# Patient Record
Sex: Female | Born: 1975 | Race: Black or African American | Hispanic: No | Marital: Single | Smoking: Former smoker
Health system: Southern US, Community
[De-identification: ages and names within clinical notes are randomized; demographics above are authoritative.]

## PROBLEM LIST (undated history)

## (undated) DIAGNOSIS — E785 Hyperlipidemia, unspecified: Secondary | ICD-10-CM

## (undated) DIAGNOSIS — K219 Gastro-esophageal reflux disease without esophagitis: Secondary | ICD-10-CM

## (undated) DIAGNOSIS — O24419 Gestational diabetes mellitus in pregnancy, unspecified control: Secondary | ICD-10-CM

## (undated) DIAGNOSIS — Z8719 Personal history of other diseases of the digestive system: Secondary | ICD-10-CM

## (undated) DIAGNOSIS — I10 Essential (primary) hypertension: Secondary | ICD-10-CM

## (undated) DIAGNOSIS — O139 Gestational [pregnancy-induced] hypertension without significant proteinuria, unspecified trimester: Secondary | ICD-10-CM

## (undated) HISTORY — PX: TUBAL LIGATION: SHX77

## (undated) HISTORY — PX: UPPER GI ENDOSCOPY: SHX6162

## (undated) HISTORY — DX: Gestational diabetes mellitus in pregnancy, unspecified control: O24.419

## (undated) HISTORY — PX: COLONOSCOPY: SHX174

## (undated) HISTORY — DX: Gestational (pregnancy-induced) hypertension without significant proteinuria, unspecified trimester: O13.9

---

## 1997-09-09 ENCOUNTER — Emergency Department (HOSPITAL_COMMUNITY): Admission: EM | Admit: 1997-09-09 | Discharge: 1997-09-09 | Payer: Self-pay | Admitting: Emergency Medicine

## 1999-02-26 ENCOUNTER — Emergency Department (HOSPITAL_COMMUNITY): Admission: EM | Admit: 1999-02-26 | Discharge: 1999-02-26 | Payer: Self-pay | Admitting: Emergency Medicine

## 1999-03-16 HISTORY — PX: BRAIN SURGERY: SHX531

## 1999-10-30 ENCOUNTER — Encounter: Payer: Self-pay | Admitting: Neurosurgery

## 1999-10-30 ENCOUNTER — Encounter: Payer: Self-pay | Admitting: Emergency Medicine

## 1999-10-30 ENCOUNTER — Ambulatory Visit (HOSPITAL_COMMUNITY): Admission: RE | Admit: 1999-10-30 | Discharge: 1999-10-30 | Payer: Self-pay | Admitting: *Deleted

## 1999-10-30 ENCOUNTER — Inpatient Hospital Stay (HOSPITAL_COMMUNITY): Admission: EM | Admit: 1999-10-30 | Discharge: 1999-11-04 | Payer: Self-pay | Admitting: Emergency Medicine

## 1999-11-01 ENCOUNTER — Encounter: Payer: Self-pay | Admitting: Neurosurgery

## 1999-11-02 ENCOUNTER — Encounter: Payer: Self-pay | Admitting: Neurosurgery

## 1999-11-04 ENCOUNTER — Encounter: Payer: Self-pay | Admitting: Neurosurgery

## 1999-12-17 ENCOUNTER — Inpatient Hospital Stay (HOSPITAL_COMMUNITY): Admission: AD | Admit: 1999-12-17 | Discharge: 1999-12-17 | Payer: Self-pay | Admitting: *Deleted

## 2000-01-21 ENCOUNTER — Ambulatory Visit (HOSPITAL_COMMUNITY): Admission: RE | Admit: 2000-01-21 | Discharge: 2000-01-21 | Payer: Self-pay | Admitting: *Deleted

## 2000-02-11 ENCOUNTER — Encounter: Admission: RE | Admit: 2000-02-11 | Discharge: 2000-02-11 | Payer: Self-pay | Admitting: Obstetrics

## 2000-03-02 ENCOUNTER — Encounter: Admission: RE | Admit: 2000-03-02 | Discharge: 2000-03-02 | Payer: Self-pay | Admitting: Obstetrics & Gynecology

## 2000-03-11 ENCOUNTER — Ambulatory Visit (HOSPITAL_COMMUNITY): Admission: RE | Admit: 2000-03-11 | Discharge: 2000-03-11 | Payer: Self-pay | Admitting: Obstetrics

## 2000-03-30 ENCOUNTER — Encounter: Admission: RE | Admit: 2000-03-30 | Discharge: 2000-03-30 | Payer: Self-pay | Admitting: Obstetrics & Gynecology

## 2000-04-27 ENCOUNTER — Encounter: Admission: RE | Admit: 2000-04-27 | Discharge: 2000-04-27 | Payer: Self-pay | Admitting: Obstetrics & Gynecology

## 2000-05-11 ENCOUNTER — Encounter: Admission: RE | Admit: 2000-05-11 | Discharge: 2000-05-11 | Payer: Self-pay | Admitting: Obstetrics & Gynecology

## 2000-05-18 ENCOUNTER — Encounter: Admission: RE | Admit: 2000-05-18 | Discharge: 2000-05-18 | Payer: Self-pay | Admitting: Obstetrics & Gynecology

## 2000-06-01 ENCOUNTER — Encounter: Admission: RE | Admit: 2000-06-01 | Discharge: 2000-06-01 | Payer: Self-pay | Admitting: Obstetrics & Gynecology

## 2000-06-08 ENCOUNTER — Encounter: Admission: RE | Admit: 2000-06-08 | Discharge: 2000-06-08 | Payer: Self-pay | Admitting: Obstetrics & Gynecology

## 2000-06-09 ENCOUNTER — Ambulatory Visit (HOSPITAL_COMMUNITY): Admission: RE | Admit: 2000-06-09 | Discharge: 2000-06-09 | Payer: Self-pay | Admitting: *Deleted

## 2000-06-16 ENCOUNTER — Encounter: Admission: RE | Admit: 2000-06-16 | Discharge: 2000-06-16 | Payer: Self-pay | Admitting: Obstetrics

## 2000-06-20 ENCOUNTER — Encounter: Admission: RE | Admit: 2000-06-20 | Discharge: 2000-06-20 | Payer: Self-pay | Admitting: Internal Medicine

## 2000-06-23 ENCOUNTER — Encounter: Admission: RE | Admit: 2000-06-23 | Discharge: 2000-06-23 | Payer: Self-pay | Admitting: Obstetrics

## 2000-06-30 ENCOUNTER — Encounter: Admission: RE | Admit: 2000-06-30 | Discharge: 2000-06-30 | Payer: Self-pay | Admitting: Obstetrics

## 2000-07-07 ENCOUNTER — Encounter: Admission: RE | Admit: 2000-07-07 | Discharge: 2000-07-07 | Payer: Self-pay | Admitting: Obstetrics

## 2000-07-21 ENCOUNTER — Encounter: Admission: RE | Admit: 2000-07-21 | Discharge: 2000-07-21 | Payer: Self-pay | Admitting: Obstetrics

## 2000-07-28 ENCOUNTER — Inpatient Hospital Stay (HOSPITAL_COMMUNITY): Admission: AD | Admit: 2000-07-28 | Discharge: 2000-07-31 | Payer: Self-pay | Admitting: Obstetrics

## 2000-07-28 ENCOUNTER — Encounter: Admission: RE | Admit: 2000-07-28 | Discharge: 2000-07-28 | Payer: Self-pay | Admitting: Obstetrics

## 2000-08-04 ENCOUNTER — Inpatient Hospital Stay (HOSPITAL_COMMUNITY): Admission: AD | Admit: 2000-08-04 | Discharge: 2000-08-04 | Payer: Self-pay | Admitting: Obstetrics & Gynecology

## 2001-10-05 ENCOUNTER — Inpatient Hospital Stay (HOSPITAL_COMMUNITY): Admission: AD | Admit: 2001-10-05 | Discharge: 2001-10-06 | Payer: Self-pay | Admitting: General Surgery

## 2001-10-05 ENCOUNTER — Encounter (INDEPENDENT_AMBULATORY_CARE_PROVIDER_SITE_OTHER): Payer: Self-pay | Admitting: *Deleted

## 2001-10-05 ENCOUNTER — Encounter: Payer: Self-pay | Admitting: General Surgery

## 2001-10-05 ENCOUNTER — Encounter: Payer: Self-pay | Admitting: *Deleted

## 2002-03-15 HISTORY — PX: CHOLECYSTECTOMY: SHX55

## 2002-11-28 ENCOUNTER — Encounter: Payer: Self-pay | Admitting: Emergency Medicine

## 2002-11-28 ENCOUNTER — Emergency Department (HOSPITAL_COMMUNITY): Admission: EM | Admit: 2002-11-28 | Discharge: 2002-11-28 | Payer: Self-pay | Admitting: Emergency Medicine

## 2002-12-03 ENCOUNTER — Emergency Department (HOSPITAL_COMMUNITY): Admission: EM | Admit: 2002-12-03 | Discharge: 2002-12-03 | Payer: Self-pay | Admitting: Emergency Medicine

## 2004-06-01 ENCOUNTER — Emergency Department (HOSPITAL_COMMUNITY): Admission: EM | Admit: 2004-06-01 | Discharge: 2004-06-01 | Payer: Self-pay | Admitting: Family Medicine

## 2004-10-16 ENCOUNTER — Emergency Department (HOSPITAL_COMMUNITY): Admission: EM | Admit: 2004-10-16 | Discharge: 2004-10-16 | Payer: Self-pay | Admitting: Family Medicine

## 2004-10-29 ENCOUNTER — Emergency Department (HOSPITAL_COMMUNITY): Admission: EM | Admit: 2004-10-29 | Discharge: 2004-10-29 | Payer: Self-pay | Admitting: Family Medicine

## 2004-11-23 ENCOUNTER — Emergency Department (HOSPITAL_COMMUNITY): Admission: EM | Admit: 2004-11-23 | Discharge: 2004-11-23 | Payer: Self-pay | Admitting: Family Medicine

## 2004-12-07 ENCOUNTER — Ambulatory Visit (HOSPITAL_BASED_OUTPATIENT_CLINIC_OR_DEPARTMENT_OTHER): Admission: RE | Admit: 2004-12-07 | Discharge: 2004-12-08 | Payer: Self-pay | Admitting: Specialist

## 2004-12-07 ENCOUNTER — Encounter (INDEPENDENT_AMBULATORY_CARE_PROVIDER_SITE_OTHER): Payer: Self-pay | Admitting: Specialist

## 2004-12-07 ENCOUNTER — Ambulatory Visit (HOSPITAL_COMMUNITY): Admission: RE | Admit: 2004-12-07 | Discharge: 2004-12-07 | Payer: Self-pay | Admitting: Specialist

## 2005-02-23 ENCOUNTER — Emergency Department (HOSPITAL_COMMUNITY): Admission: AD | Admit: 2005-02-23 | Discharge: 2005-02-23 | Payer: Self-pay | Admitting: Emergency Medicine

## 2005-07-19 ENCOUNTER — Emergency Department (HOSPITAL_COMMUNITY): Admission: EM | Admit: 2005-07-19 | Discharge: 2005-07-19 | Payer: Self-pay | Admitting: Family Medicine

## 2006-03-15 HISTORY — PX: BREAST REDUCTION SURGERY: SHX8

## 2006-03-15 HISTORY — PX: BREAST SURGERY: SHX581

## 2006-06-13 ENCOUNTER — Emergency Department (HOSPITAL_COMMUNITY): Admission: EM | Admit: 2006-06-13 | Discharge: 2006-06-14 | Payer: Self-pay | Admitting: Emergency Medicine

## 2007-10-14 ENCOUNTER — Emergency Department (HOSPITAL_COMMUNITY): Admission: EM | Admit: 2007-10-14 | Discharge: 2007-10-14 | Payer: Self-pay | Admitting: Emergency Medicine

## 2009-03-15 ENCOUNTER — Inpatient Hospital Stay (HOSPITAL_COMMUNITY): Admission: AD | Admit: 2009-03-15 | Discharge: 2009-03-15 | Payer: Self-pay | Admitting: Obstetrics & Gynecology

## 2009-04-10 ENCOUNTER — Ambulatory Visit: Payer: Self-pay | Admitting: Family Medicine

## 2009-04-28 ENCOUNTER — Ambulatory Visit (HOSPITAL_COMMUNITY): Admission: RE | Admit: 2009-04-28 | Discharge: 2009-04-28 | Payer: Self-pay | Admitting: Family Medicine

## 2009-05-08 ENCOUNTER — Ambulatory Visit: Payer: Self-pay | Admitting: Obstetrics and Gynecology

## 2009-05-22 ENCOUNTER — Ambulatory Visit (HOSPITAL_COMMUNITY): Admission: RE | Admit: 2009-05-22 | Discharge: 2009-05-22 | Payer: Self-pay | Admitting: Family Medicine

## 2009-06-05 ENCOUNTER — Ambulatory Visit (HOSPITAL_COMMUNITY): Admission: RE | Admit: 2009-06-05 | Discharge: 2009-06-05 | Payer: Self-pay | Admitting: Obstetrics & Gynecology

## 2009-06-12 ENCOUNTER — Ambulatory Visit: Payer: Self-pay | Admitting: Obstetrics & Gynecology

## 2009-07-10 ENCOUNTER — Ambulatory Visit: Payer: Self-pay | Admitting: Obstetrics & Gynecology

## 2009-07-10 ENCOUNTER — Encounter: Payer: Self-pay | Admitting: Obstetrics & Gynecology

## 2009-07-10 LAB — CONVERTED CEMR LAB
HCT: 31.2 % — ABNORMAL LOW (ref 36.0–46.0)
Hemoglobin: 9.8 g/dL — ABNORMAL LOW (ref 12.0–15.0)
MCHC: 31.4 g/dL (ref 30.0–36.0)
MCV: 90.2 fL (ref 78.0–100.0)
Platelets: 320 10*3/uL (ref 150–400)
RBC: 3.46 M/uL — ABNORMAL LOW (ref 3.87–5.11)
RDW: 12.8 % (ref 11.5–15.5)
WBC: 16.9 10*3/uL — ABNORMAL HIGH (ref 4.0–10.5)

## 2009-08-07 ENCOUNTER — Encounter (INDEPENDENT_AMBULATORY_CARE_PROVIDER_SITE_OTHER): Payer: Self-pay | Admitting: *Deleted

## 2009-08-07 ENCOUNTER — Ambulatory Visit: Payer: Self-pay | Admitting: Obstetrics & Gynecology

## 2009-08-08 LAB — CONVERTED CEMR LAB
HCT: 31.5 % — ABNORMAL LOW (ref 36.0–46.0)
Hemoglobin: 10 g/dL — ABNORMAL LOW (ref 12.0–15.0)
MCHC: 31.7 g/dL (ref 30.0–36.0)
MCV: 90.3 fL (ref 78.0–100.0)
Platelets: 342 10*3/uL (ref 150–400)
RBC: 3.49 M/uL — ABNORMAL LOW (ref 3.87–5.11)
RDW: 13.4 % (ref 11.5–15.5)
WBC: 16.4 10*3/uL — ABNORMAL HIGH (ref 4.0–10.5)

## 2009-08-13 ENCOUNTER — Ambulatory Visit: Payer: Self-pay | Admitting: Obstetrics and Gynecology

## 2009-08-13 ENCOUNTER — Encounter (INDEPENDENT_AMBULATORY_CARE_PROVIDER_SITE_OTHER): Payer: Self-pay | Admitting: *Deleted

## 2009-08-25 ENCOUNTER — Encounter: Admission: RE | Admit: 2009-08-25 | Discharge: 2009-09-01 | Payer: Self-pay | Admitting: Obstetrics and Gynecology

## 2009-08-25 ENCOUNTER — Ambulatory Visit: Payer: Self-pay | Admitting: Obstetrics & Gynecology

## 2009-09-01 ENCOUNTER — Encounter (INDEPENDENT_AMBULATORY_CARE_PROVIDER_SITE_OTHER): Payer: Self-pay | Admitting: *Deleted

## 2009-09-01 ENCOUNTER — Ambulatory Visit: Payer: Self-pay | Admitting: Obstetrics & Gynecology

## 2009-09-01 LAB — CONVERTED CEMR LAB
HCT: 31.8 % — ABNORMAL LOW (ref 36.0–46.0)
Hemoglobin: 10.1 g/dL — ABNORMAL LOW (ref 12.0–15.0)
MCHC: 31.8 g/dL (ref 30.0–36.0)
MCV: 89.6 fL (ref 78.0–100.0)
Platelets: 340 10*3/uL (ref 150–400)
RBC: 3.55 M/uL — ABNORMAL LOW (ref 3.87–5.11)
RDW: 13.3 % (ref 11.5–15.5)
WBC: 14.8 10*3/uL — ABNORMAL HIGH (ref 4.0–10.5)

## 2009-09-04 ENCOUNTER — Ambulatory Visit (HOSPITAL_COMMUNITY): Admission: RE | Admit: 2009-09-04 | Discharge: 2009-09-04 | Payer: Self-pay | Admitting: Obstetrics & Gynecology

## 2009-09-08 ENCOUNTER — Ambulatory Visit: Payer: Self-pay | Admitting: Obstetrics & Gynecology

## 2009-09-22 ENCOUNTER — Inpatient Hospital Stay (HOSPITAL_COMMUNITY): Admission: AD | Admit: 2009-09-22 | Discharge: 2009-09-22 | Payer: Self-pay | Admitting: Obstetrics & Gynecology

## 2009-09-22 ENCOUNTER — Ambulatory Visit: Payer: Self-pay | Admitting: Obstetrics & Gynecology

## 2009-09-22 ENCOUNTER — Ambulatory Visit: Payer: Self-pay | Admitting: Family Medicine

## 2009-09-29 ENCOUNTER — Ambulatory Visit: Payer: Self-pay | Admitting: Obstetrics and Gynecology

## 2009-10-06 ENCOUNTER — Ambulatory Visit: Payer: Self-pay | Admitting: Obstetrics & Gynecology

## 2009-10-06 LAB — CONVERTED CEMR LAB
Chlamydia, DNA Probe: NEGATIVE
GC Probe Amp, Genital: NEGATIVE

## 2009-10-07 ENCOUNTER — Encounter: Payer: Self-pay | Admitting: Obstetrics & Gynecology

## 2009-10-07 LAB — CONVERTED CEMR LAB
Trich, Wet Prep: NONE SEEN
WBC, Wet Prep HPF POC: NONE SEEN
Yeast Wet Prep HPF POC: NONE SEEN

## 2009-10-13 ENCOUNTER — Ambulatory Visit: Payer: Self-pay | Admitting: Family Medicine

## 2009-10-13 ENCOUNTER — Encounter: Payer: Self-pay | Admitting: Obstetrics & Gynecology

## 2009-10-13 LAB — CONVERTED CEMR LAB
ALT: 21 units/L (ref 0–35)
AST: 15 units/L (ref 0–37)
Albumin: 3.3 g/dL — ABNORMAL LOW (ref 3.5–5.2)
Alkaline Phosphatase: 114 units/L (ref 39–117)
BUN: 4 mg/dL — ABNORMAL LOW (ref 6–23)
CO2: 20 meq/L (ref 19–32)
Calcium: 9 mg/dL (ref 8.4–10.5)
Chloride: 106 meq/L (ref 96–112)
Creatinine, Ser: 0.43 mg/dL (ref 0.40–1.20)
Glucose, Bld: 73 mg/dL (ref 70–99)
HCT: 33 % — ABNORMAL LOW (ref 36.0–46.0)
Hemoglobin: 10.6 g/dL — ABNORMAL LOW (ref 12.0–15.0)
MCHC: 32.1 g/dL (ref 30.0–36.0)
MCV: 87.8 fL (ref 78.0–100.0)
Platelets: 353 10*3/uL (ref 150–400)
Potassium: 3.8 meq/L (ref 3.5–5.3)
RBC: 3.76 M/uL — ABNORMAL LOW (ref 3.87–5.11)
RDW: 13.7 % (ref 11.5–15.5)
Sodium: 138 meq/L (ref 135–145)
Total Bilirubin: 0.6 mg/dL (ref 0.3–1.2)
Total Protein: 5.9 g/dL — ABNORMAL LOW (ref 6.0–8.3)
WBC: 13.4 10*3/uL — ABNORMAL HIGH (ref 4.0–10.5)

## 2009-10-15 ENCOUNTER — Encounter: Payer: Self-pay | Admitting: Obstetrics & Gynecology

## 2009-10-15 LAB — CONVERTED CEMR LAB
Collection Interval-CRCL: 24 hr
Creatinine 24 HR UR: 1362 mg/24hr (ref 700–1800)
Creatinine Clearance: 220 mL/min — ABNORMAL HIGH (ref 75–115)
Creatinine, Urine: 43.9 mg/dL
Protein, Ur: 248 mg/24hr — ABNORMAL HIGH (ref 50–100)

## 2009-10-20 ENCOUNTER — Ambulatory Visit: Payer: Self-pay | Admitting: Obstetrics & Gynecology

## 2009-10-20 ENCOUNTER — Inpatient Hospital Stay (HOSPITAL_COMMUNITY): Admission: RE | Admit: 2009-10-20 | Discharge: 2009-10-24 | Payer: Self-pay | Admitting: Family Medicine

## 2009-10-20 ENCOUNTER — Ambulatory Visit: Payer: Self-pay | Admitting: Obstetrics and Gynecology

## 2009-10-20 ENCOUNTER — Other Ambulatory Visit: Payer: Self-pay | Admitting: Family Medicine

## 2009-12-17 ENCOUNTER — Ambulatory Visit: Payer: Self-pay | Admitting: Obstetrics & Gynecology

## 2010-01-28 ENCOUNTER — Ambulatory Visit: Payer: Self-pay | Admitting: Obstetrics and Gynecology

## 2010-04-05 ENCOUNTER — Encounter: Payer: Self-pay | Admitting: Family Medicine

## 2010-04-21 ENCOUNTER — Ambulatory Visit: Payer: Medicaid Other

## 2010-04-21 DIAGNOSIS — Z3049 Encounter for surveillance of other contraceptives: Secondary | ICD-10-CM

## 2010-05-29 LAB — POCT URINALYSIS DIPSTICK
Bilirubin Urine: NEGATIVE
Glucose, UA: 100 mg/dL — AB
Glucose, UA: NEGATIVE mg/dL
Ketones, ur: NEGATIVE mg/dL
Ketones, ur: NEGATIVE mg/dL
Nitrite: NEGATIVE
Nitrite: NEGATIVE
Protein, ur: 100 mg/dL — AB
Protein, ur: 100 mg/dL — AB
Specific Gravity, Urine: 1.02 (ref 1.005–1.030)
Specific Gravity, Urine: 1.02 (ref 1.005–1.030)
Urobilinogen, UA: 1 mg/dL (ref 0.0–1.0)
Urobilinogen, UA: 2 mg/dL — ABNORMAL HIGH (ref 0.0–1.0)
pH: 6.5 (ref 5.0–8.0)
pH: 7 (ref 5.0–8.0)

## 2010-05-29 LAB — CREATININE CLEARANCE, URINE, 24 HOUR
Collection Interval-CRCL: 24 hours
Creatinine Clearance: 237 mL/min — ABNORMAL HIGH (ref 75–115)
Creatinine, 24H Ur: 1469 mg/d (ref 700–1800)
Creatinine, Urine: 47 mg/dL
Creatinine: 0.43 mg/dL (ref 0.4–1.2)
Urine Total Volume-CRCL: 3125 mL

## 2010-05-29 LAB — CBC
HCT: 31.8 % — ABNORMAL LOW (ref 36.0–46.0)
HCT: 31.9 % — ABNORMAL LOW (ref 36.0–46.0)
HCT: 32.6 % — ABNORMAL LOW (ref 36.0–46.0)
Hemoglobin: 10.6 g/dL — ABNORMAL LOW (ref 12.0–15.0)
Hemoglobin: 10.6 g/dL — ABNORMAL LOW (ref 12.0–15.0)
Hemoglobin: 11 g/dL — ABNORMAL LOW (ref 12.0–15.0)
MCH: 29.7 pg (ref 26.0–34.0)
MCH: 29.8 pg (ref 26.0–34.0)
MCH: 30 pg (ref 26.0–34.0)
MCHC: 33.4 g/dL (ref 30.0–36.0)
MCHC: 33.5 g/dL (ref 30.0–36.0)
MCHC: 33.9 g/dL (ref 30.0–36.0)
MCV: 88.5 fL (ref 78.0–100.0)
MCV: 89 fL (ref 78.0–100.0)
MCV: 89.2 fL (ref 78.0–100.0)
Platelets: 307 10*3/uL (ref 150–400)
Platelets: 312 10*3/uL (ref 150–400)
Platelets: 316 10*3/uL (ref 150–400)
RBC: 3.56 MIL/uL — ABNORMAL LOW (ref 3.87–5.11)
RBC: 3.58 MIL/uL — ABNORMAL LOW (ref 3.87–5.11)
RBC: 3.68 MIL/uL — ABNORMAL LOW (ref 3.87–5.11)
RDW: 13.2 % (ref 11.5–15.5)
RDW: 13.3 % (ref 11.5–15.5)
RDW: 13.7 % (ref 11.5–15.5)
WBC: 11.8 10*3/uL — ABNORMAL HIGH (ref 4.0–10.5)
WBC: 13.7 10*3/uL — ABNORMAL HIGH (ref 4.0–10.5)
WBC: 16 10*3/uL — ABNORMAL HIGH (ref 4.0–10.5)

## 2010-05-29 LAB — GLUCOSE, CAPILLARY
Glucose-Capillary: 101 mg/dL — ABNORMAL HIGH (ref 70–99)
Glucose-Capillary: 107 mg/dL — ABNORMAL HIGH (ref 70–99)
Glucose-Capillary: 108 mg/dL — ABNORMAL HIGH (ref 70–99)
Glucose-Capillary: 136 mg/dL — ABNORMAL HIGH (ref 70–99)
Glucose-Capillary: 142 mg/dL — ABNORMAL HIGH (ref 70–99)
Glucose-Capillary: 144 mg/dL — ABNORMAL HIGH (ref 70–99)
Glucose-Capillary: 81 mg/dL (ref 70–99)
Glucose-Capillary: 81 mg/dL (ref 70–99)
Glucose-Capillary: 92 mg/dL (ref 70–99)

## 2010-05-29 LAB — COMPREHENSIVE METABOLIC PANEL
ALT: 28 U/L (ref 0–35)
AST: 18 U/L (ref 0–37)
Albumin: 2.7 g/dL — ABNORMAL LOW (ref 3.5–5.2)
Alkaline Phosphatase: 114 U/L (ref 39–117)
BUN: 3 mg/dL — ABNORMAL LOW (ref 6–23)
CO2: 23 mEq/L (ref 19–32)
Calcium: 8.8 mg/dL (ref 8.4–10.5)
Chloride: 107 mEq/L (ref 96–112)
Creatinine, Ser: 0.43 mg/dL (ref 0.4–1.2)
GFR calc Af Amer: >60 mL/min (ref >60)
GFR calc non Af Amer: >60 mL/min (ref >60)
Glucose, Bld: 91 mg/dL (ref 70–99)
Potassium: 3 mEq/L — ABNORMAL LOW (ref 3.5–5.1)
Sodium: 135 mEq/L (ref 135–145)
Total Bilirubin: 0.5 mg/dL (ref 0.3–1.2)
Total Protein: 6 g/dL (ref 6.0–8.3)

## 2010-05-29 LAB — PROTEIN, URINE, 24 HOUR
Collection Interval-UPROT: 24 hours
Protein, 24H Urine: 406 mg/d — ABNORMAL HIGH (ref 50–100)
Protein, Urine: 13 mg/dL
Urine Total Volume-UPROT: 3125 mL

## 2010-05-29 LAB — RPR: RPR Ser Ql: NONREACTIVE

## 2010-05-29 LAB — MRSA PCR SCREENING: MRSA by PCR: NEGATIVE

## 2010-05-30 LAB — POCT URINALYSIS DIP (DEVICE)
Bilirubin Urine: NEGATIVE
Bilirubin Urine: NEGATIVE
Glucose, UA: NEGATIVE mg/dL
Glucose, UA: NEGATIVE mg/dL
Ketones, ur: NEGATIVE mg/dL
Ketones, ur: NEGATIVE mg/dL
Nitrite: NEGATIVE
Nitrite: NEGATIVE
Protein, ur: 30 mg/dL — AB
Protein, ur: NEGATIVE mg/dL
Specific Gravity, Urine: 1.015 (ref 1.005–1.030)
Specific Gravity, Urine: 1.015 (ref 1.005–1.030)
Urobilinogen, UA: 0.2 mg/dL (ref 0.0–1.0)
Urobilinogen, UA: 1 mg/dL (ref 0.0–1.0)
pH: 7 (ref 5.0–8.0)
pH: 7 (ref 5.0–8.0)

## 2010-05-30 LAB — HCG, QUANTITATIVE, PREGNANCY: hCG, Beta Chain, Quant, S: 57264 m[IU]/mL — ABNORMAL HIGH (ref <5)

## 2010-05-30 LAB — URINALYSIS, ROUTINE W REFLEX MICROSCOPIC
Bilirubin Urine: NEGATIVE
Glucose, UA: NEGATIVE mg/dL
Hgb urine dipstick: NEGATIVE
Ketones, ur: NEGATIVE mg/dL
Nitrite: NEGATIVE
Protein, ur: NEGATIVE mg/dL
Specific Gravity, Urine: 1.025 (ref 1.005–1.030)
Urobilinogen, UA: 0.2 mg/dL (ref 0.0–1.0)
pH: 6.5 (ref 5.0–8.0)

## 2010-05-30 LAB — WET PREP, GENITAL
Trich, Wet Prep: NONE SEEN
Yeast Wet Prep HPF POC: NONE SEEN

## 2010-05-30 LAB — POCT PREGNANCY, URINE: Preg Test, Ur: POSITIVE

## 2010-05-30 LAB — GC/CHLAMYDIA PROBE AMP, GENITAL
Chlamydia, DNA Probe: NEGATIVE
GC Probe Amp, Genital: NEGATIVE

## 2010-05-31 LAB — CBC
HCT: 31.4 % — ABNORMAL LOW (ref 36.0–46.0)
Hemoglobin: 10.5 g/dL — ABNORMAL LOW (ref 12.0–15.0)
MCH: 29.8 pg (ref 26.0–34.0)
MCHC: 33.5 g/dL (ref 30.0–36.0)
MCV: 89.1 fL (ref 78.0–100.0)
Platelets: 333 10*3/uL (ref 150–400)
RBC: 3.53 MIL/uL — ABNORMAL LOW (ref 3.87–5.11)
RDW: 13.1 % (ref 11.5–15.5)
WBC: 16.1 10*3/uL — ABNORMAL HIGH (ref 4.0–10.5)

## 2010-05-31 LAB — COMPREHENSIVE METABOLIC PANEL
ALT: 24 U/L (ref 0–35)
AST: 17 U/L (ref 0–37)
Albumin: 2.9 g/dL — ABNORMAL LOW (ref 3.5–5.2)
Alkaline Phosphatase: 102 U/L (ref 39–117)
BUN: 2 mg/dL — ABNORMAL LOW (ref 6–23)
CO2: 22 mEq/L (ref 19–32)
Calcium: 8.8 mg/dL (ref 8.4–10.5)
Chloride: 107 mEq/L (ref 96–112)
Creatinine, Ser: 0.44 mg/dL (ref 0.4–1.2)
GFR calc Af Amer: >60 mL/min (ref >60)
GFR calc non Af Amer: >60 mL/min (ref >60)
Glucose, Bld: 82 mg/dL (ref 70–99)
Potassium: 3.6 mEq/L (ref 3.5–5.1)
Sodium: 139 mEq/L (ref 135–145)
Total Bilirubin: 0.7 mg/dL (ref 0.3–1.2)
Total Protein: 6 g/dL (ref 6.0–8.3)

## 2010-05-31 LAB — POCT URINALYSIS DIP (DEVICE)
Bilirubin Urine: NEGATIVE
Bilirubin Urine: NEGATIVE
Bilirubin Urine: NEGATIVE
Bilirubin Urine: NEGATIVE
Glucose, UA: NEGATIVE mg/dL
Glucose, UA: NEGATIVE mg/dL
Glucose, UA: >=1000 mg/dL — AB
Glucose, UA: NEGATIVE mg/dL
Hgb urine dipstick: NEGATIVE
Ketones, ur: NEGATIVE mg/dL
Ketones, ur: NEGATIVE mg/dL
Ketones, ur: NEGATIVE mg/dL
Nitrite: NEGATIVE
Nitrite: NEGATIVE
Nitrite: NEGATIVE
Nitrite: NEGATIVE
Protein, ur: 30 mg/dL — AB
Protein, ur: NEGATIVE mg/dL
Protein, ur: 30 mg/dL — AB
Protein, ur: 30 mg/dL — AB
Specific Gravity, Urine: 1.02 (ref 1.005–1.030)
Specific Gravity, Urine: 1.015 (ref 1.005–1.030)
Specific Gravity, Urine: 1.015 (ref 1.005–1.030)
Specific Gravity, Urine: 1.02 (ref 1.005–1.030)
Urobilinogen, UA: 1 mg/dL (ref 0.0–1.0)
Urobilinogen, UA: 0.2 mg/dL (ref 0.0–1.0)
Urobilinogen, UA: 1 mg/dL (ref 0.0–1.0)
Urobilinogen, UA: 1 mg/dL (ref 0.0–1.0)
pH: 7 (ref 5.0–8.0)
pH: 7.5 (ref 5.0–8.0)
pH: 5.5 (ref 5.0–8.0)
pH: 6.5 (ref 5.0–8.0)

## 2010-05-31 LAB — CREATININE CLEARANCE, URINE, 24 HOUR
Collection Interval-CRCL: 24 hours
Creatinine Clearance: 237 mL/min — ABNORMAL HIGH (ref 75–115)
Creatinine, 24H Ur: 1502 mg/d (ref 700–1800)
Creatinine, Urine: 65.3 mg/dL
Creatinine: 0.44 mg/dL (ref 0.4–1.2)
Urine Total Volume-CRCL: 2300 mL

## 2010-05-31 LAB — URINALYSIS, ROUTINE W REFLEX MICROSCOPIC
Bilirubin Urine: NEGATIVE
Glucose, UA: NEGATIVE mg/dL
Ketones, ur: NEGATIVE mg/dL
Leukocytes, UA: NEGATIVE
Nitrite: NEGATIVE
Protein, ur: NEGATIVE mg/dL
Specific Gravity, Urine: 1.02 (ref 1.005–1.030)
Urobilinogen, UA: 0.2 mg/dL (ref 0.0–1.0)
pH: 7 (ref 5.0–8.0)

## 2010-05-31 LAB — GLUCOSE, CAPILLARY
Glucose-Capillary: 88 mg/dL (ref 70–99)
Glucose-Capillary: 131 mg/dL — ABNORMAL HIGH (ref 70–99)
Glucose-Capillary: 89 mg/dL (ref 70–99)

## 2010-05-31 LAB — PROTEIN, URINE, 24 HOUR
Collection Interval-UPROT: 24 hours
Protein, 24H Urine: 276 mg/d — ABNORMAL HIGH (ref 50–100)
Protein, Urine: 12 mg/dL
Urine Total Volume-UPROT: 2300 mL

## 2010-05-31 LAB — PROTEIN / CREATININE RATIO, URINE
Creatinine, Urine: 94 mg/dL
Protein Creatinine Ratio: 0.37 — ABNORMAL HIGH (ref 0.00–0.15)
Total Protein, Urine: 35 mg/dL

## 2010-05-31 LAB — URINE MICROSCOPIC-ADD ON

## 2010-06-01 LAB — POCT URINALYSIS DIP (DEVICE)
Bilirubin Urine: NEGATIVE
Bilirubin Urine: NEGATIVE
Glucose, UA: NEGATIVE mg/dL
Glucose, UA: NEGATIVE mg/dL
Ketones, ur: NEGATIVE mg/dL
Ketones, ur: NEGATIVE mg/dL
Nitrite: NEGATIVE
Nitrite: NEGATIVE
Protein, ur: NEGATIVE mg/dL
Protein, ur: 30 mg/dL — AB
Specific Gravity, Urine: 1.02 (ref 1.005–1.030)
Specific Gravity, Urine: 1.02 (ref 1.005–1.030)
Urobilinogen, UA: 1 mg/dL (ref 0.0–1.0)
Urobilinogen, UA: 2 mg/dL — ABNORMAL HIGH (ref 0.0–1.0)
pH: 6.5 (ref 5.0–8.0)
pH: 7 (ref 5.0–8.0)

## 2010-06-02 LAB — POCT URINALYSIS DIP (DEVICE)
Bilirubin Urine: NEGATIVE
Glucose, UA: NEGATIVE mg/dL
Ketones, ur: NEGATIVE mg/dL
Nitrite: NEGATIVE
Protein, ur: NEGATIVE mg/dL
Specific Gravity, Urine: 1.025 (ref 1.005–1.030)
Urobilinogen, UA: 0.2 mg/dL (ref 0.0–1.0)
pH: 6 (ref 5.0–8.0)

## 2010-06-03 LAB — POCT URINALYSIS DIP (DEVICE)
Bilirubin Urine: NEGATIVE
Glucose, UA: NEGATIVE mg/dL
Ketones, ur: NEGATIVE mg/dL
Nitrite: NEGATIVE
Protein, ur: NEGATIVE mg/dL
Specific Gravity, Urine: 1.025 (ref 1.005–1.030)
Urobilinogen, UA: 0.2 mg/dL (ref 0.0–1.0)
pH: 6.5 (ref 5.0–8.0)

## 2010-06-05 LAB — POCT URINALYSIS DIP (DEVICE)
Bilirubin Urine: NEGATIVE
Glucose, UA: NEGATIVE mg/dL
Ketones, ur: NEGATIVE mg/dL
Nitrite: NEGATIVE
Protein, ur: NEGATIVE mg/dL
Specific Gravity, Urine: 1.025 (ref 1.005–1.030)
Urobilinogen, UA: 1 mg/dL (ref 0.0–1.0)
pH: 7 (ref 5.0–8.0)

## 2010-07-14 ENCOUNTER — Ambulatory Visit: Payer: Medicaid Other

## 2010-07-31 NOTE — Op Note (Signed)
[facility]  Patient:    Meghan Davila, Meghan Davila                      MRN: [id number] Proc. Date: 07/28/00 Adm. Date:  [id number] Disc. Date: [id number] Attending:  Antionette Char                           Operative Report  PREOPERATIVE DIAGNOSES:       1. Intrauterine pregnancy at term.                               2. Maternal Arnold-Chiari malformation.                               3. Mild pregnancy-induced hypertension.  POSTOPERATIVE DIAGNOSES:      1. Intrauterine pregnancy at term.                               2. Maternal Arnold-Chiari malformation.                               3. Mild pregnancy-induced hypertension.  PROCEDURE:                    Primary low uterine flap elliptical cesarean delivery.  SURGEON:                      Roseanna Rainbow, M.D.  ANESTHESIA:                   Epidural.  ESTIMATED BLOOD LOSS:         800 cc.  COMPLICATIONS:                None.  DESCRIPTION OF PROCEDURE:     The patient was taken to the operating room.  an epidural anesthetic was administered without difficulty.  She was then placed in the dorsal supine position with a leftward tilt.  She was prepped and draped in the usual sterile fashion.  A Pfannenstiel skin incision was then made with a scalpel and carried through to the underlying layer of fascia with the Bovie.  The fascia was incised in the midline and this incision was then extended laterally with curved Mayo scissors.  The inferior aspect of the fascial incision was then grasped with straight Kochers, tented up and the underlying rectus muscles dissected off.  The superior aspect of the fascial incision was manipulated in a similar fashion.  The rectus muscles were separated in the midline.  The parietal peritoneum was tented up and entered sharply with Metzanbaum scissors.  This incision was then extended superiorly and inferiorly with good visualization of the bladder.  A bladder  blade was placed.  The vesicouterine peritoneum was grasped with pickups and entered sharply with Metzenbaum scissors.  This incision was then extended bilaterally and a bladder flap created digitally.  The bladder blade was replaced and the lower uterine segment was incised in a transverse fashion with a scalpel. This incision was then extended bilaterally with bandage scissors.  The infants head was then delivered atraumatically.  The infant was suctioned with a bulb suction.  The cord was clamped and cut.  The infant was then handed off to the waiting neonatologist.  Apgars were 9 at one and five minutes.  There was a loose nuchal cord noted x 1.  She was delivered of a live-born female.  The placenta was then removed.  The uterus was evacuated of amniotic fluid, clots and debris with a moistened laparotomy sponge.  The uterus was exteriorized and the uterine incision was repaired in a running, interlocking fashion with 0 Monocryl.  Excellent hemostasis was noted.  The uterus was returned to the abdomen.  The gutters were then copiously irrigated.  The fascia was reapproximated with 0 Vicryl.  The skin was reapproximated with staples.  At the close of the procedure, the instrument and packs counts were said to be correct x 2.  The patient was taken to the PACU awake and in stable condition. DD:  09/19/00 TD:  09/20/00 Job: [id number]

## 2010-07-31 NOTE — Discharge Summary (Signed)
[facility]  Patient:    Meghan Davila, Meghan Davila Visit Number: [id number] MRN: [id number]          Service Type: MED Location: [facility] Attending Physician:  Antionette Char Dictated by:   Delano Metz, M.D. Adm. Date:  08/04/2000 Disc. Date: 08/04/2000                             Discharge Summary  HOSPITAL COURSE:              The patient is a 35 year old, G1, P0, who delivered a viable female infant with Apgars of 9 at one minute and 9 at five minutes via low transverse cesarean section. Mother had mild PIH. There was a nuchal cord x 1.  DISCHARGE MEDICATIONS:        1. Motrin 800 mg one p.o. q.6h. p.r.n. pain.                               2. A Depo-Provera shot was given prior to                                  discharge.                               3. Prenatal vitamins one per day.  DISPOSITION:                  The patient was discharged to home.  DISCHARGE FOLLOWUP:           The patient will follow up in MAU in four days for staple removal. She will follow up in [facility] in six weeks for postpartum followup. Dictated by:   Delano Metz, M.D. Attending Physician:  Antionette Char DD:  11/09/00 TD:  11/09/00 Job: [id number]

## 2010-07-31 NOTE — Op Note (Signed)
[place]. [facility]  Patient:    Meghan Davila, Meghan Davila                        MRN: [id number] Proc. Date: 10/31/99 Adm. Date:  [id number] Attending:  Barton Fanny                           Operative Report  PREOPERATIVE DIAGNOSIS:  Obstructive hydrocephalus.  POSTOPERATIVE DIAGNOSIS:  Obstructive hydrocephalus.  OPERATION PERFORMED:  Placement of right frontal ventriculoperitoneal shunt (medium pressure-Hakim).  SURGEON:  Shirlean Kelly, M.D.  ANESTHESIA:  General endotracheal.  INDICATIONS:  The patient is a 35 year old woman who presented with a three-week history of headache.  She was found to have obstructive hydrocephalus and a Chiari malformation.  A decision was made to proceed with ventriculoperitoneal shunting and it is anticipated to proceed with a suboccipital craniectomy and upper cervical laminectomy and duraplasty for treatment of her Chiari malformation in a staged fashion in one to two months.  DESCRIPTION OF PROCEDURE:  The patient was brought to the operating room and placed under general endotracheal anesthesia.  The right side of the scalp was was shaved and the right side of the scalp, neck, chest and abdomen were prepped with Betadine soap and solution and draped in a sterile fashion.  A 5 cm incision was made in the right upper quadrant and carried down to the subcutaneous tissue; bipolar cautery and electrocautery was used to maintain hemostasis.  Dissection was carried down through the anterior rectus fascia which was incised in a horizontal manner.  The rectus muscle was split and the posterior rectus sheath was identified.  It was carefully opened, as was the underlying peritoneum, taking care to avoid any injury to underlying intra-abdominal contents.  The peritoneum was identified and carefully opened and then a pursestring suture using 3-0 silk suture was placed.  We then made a 4 cm incision in the right frontal  region in the midpupillary line paralleling the sagittal midline.  The line of each of the incisions was infiltrated with locally anesthetized with epinephrine.  Bipolar cautery was used to establish hemostasis and then the periosteum was split and a bur hole made with an AM-3 bur and then we prepared the shunt catheter for passing.  The valve and reservoir portion was passed subcutaneously.  A stab incision was made behind the right ear.  We then passed it another few inches and then, using a shunt passer, passed from the second intervening incision down to the abdominal incision and the distal portion of the catheter was passed.  The portion of the catheter, including the reservoir and valve, were connected to the distal peritoneal catheter with a straight connector which was secured with 0 silk ties and the system was flushed with saline.  The intervening incisions were closed with surgical staples.  We then opened the dura and coagulated the dural edges.  The pial surface was coagulated and incised and then the ventricular catheter was passed into the right frontal horn and connected to the distal catheter with a right angle connector.  The connections were secured with 0 silk ties and CSF was seen to flow from the distal end of the peritoneal catheter.  The distal end of the peritoneal catheter was then passed into the peritoneal cavity and once the catheter was fully in place, the pursestring suture was secured.  We then closed the incisions, the abdominal  incision was closed with the anterior rectus fascia being closed with interrupted, undyed 2-0 Vicryl sutures, the subcutaneous and subcuticular closed with interrupted inverted 2-0 Vicryl sutures and the skin was closed with Dermabond.  In the cranial incision, the galea was closed with interrupted inverted 2-0 Vicryl sutures and the skin incisions were closed with surgical staples.  The cranial incision and the two intervening  incisions were dressed with Adaptic and sterile gauze. The patient tolerated the procedure well.  The estimated blood loss was less than 25 cc.  Sponge and needle counts correct.  Following the surgery, the patient was reversed from the anesthetic to be extubated and transferred to the neurosurgical intensive care unit for further care. DD:  10/31/99 TD:  11/01/99 Job: [id number]

## 2010-07-31 NOTE — H&P (Signed)
[place]  Patient:    Meghan Davila, Meghan Davila                        MRN: [id number] Adm. Date:  [id number] Attending:  Barton Fanny                         History and Physical  HISTORY OF PRESENT ILLNESS:  Patient is a 35 year old right-handed black female who presented to the [facility] emergency room with a three-week history of "bad headaches."  She describes them as being located in the right periorbital and temporal region, and she cannot recall any precipitating event that lead to the headaches.  She denies any history of trauma, and she had not had any previous headaches.  She does describe some mild nausea associated with them, but no vomiting.  She denies any blurred vision, double vision, dysphasia, or weakness.  Patient was evaluated by the emergency room staff with CT scan of the brain and, subsequently, MRI scan of the brain.   These revealed:  1. 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  There does not appear to be, though, any syringobulbia or syringomyelia.  PAST MEDICAL HISTORY:  The patient denies any history of hypertension, heart disease, lung disease, peptic ulcer disease, diabetes, cancer, or stroke.  She has had no previous surgeries.  ALLERGIES:  She denies allergies to medications.  MEDICATIONS:  She takes no medications on a regular basis.  FAMILY HISTORY:  Her mother is age 32 and is treated for hypertension.  Her father is age 54 and is healthy.  Her brother is age 80 and is healthy, and her sister is age 49 and has asthma.  SOCIAL HISTORY:  Patient is unmarried and has no children.  She works as a Child psychotherapist at [employer].  She dropped out of school during the 12th grade.  She smokes half a pack of cigarettes per day and has been smoking for the past two years.  She does not drink alcoholic  beverages.  She lives with her mother.  REVIEW OF SYSTEMS:  Notable for those difficulties as described in her history of present illness and past medical history, but is otherwise unremarkable.  PHYSICAL EXAMINATION:  GENERAL:  Patient is a somewhat heavy-set black female in no acute distress.  VITAL SIGNS:  Temperature 98.8, pulse 67, blood pressure 143/78, respiratory rate 20.  LUNGS:  Clear to auscultation.  She has symmetrical respiratory excursion.  HEART:  Regular rate and rhythm, normal S1 and S2.  There is no murmur.  ABDOMEN:  Soft, nondistended, nontender.  No masses or hepatosplenomegaly are palpable.  Bowel sounds are present.  EXTREMITIES:  Shows no cyanosis, clubbing, or edema.  NEUROLOGIC:  Shows, on mental status, patient is awake, alert, she is fully oriented, and she follows commands.  Her speech is fluent and she has good comprehension.  Cranial nerves show, on funduscopic examination, no evidence of papilledema, hemorrhages, or exudates.  Extraocular movements are intact. She has no nystagmus.  Facial sensation is intact to both pinprick and light touch.  Facial movement is symmetrical.  Hearing is intact bilaterally. Palatal movement is symmetrical, shoulder shrug is symmetrical, and tongue protrudes in the midline and well to either side.  Motor examination shows 5/5 strength in the upper and lower extremities.  She has no drift  of the upper extremities.  Sensation is intact to pinprick and light touch throughout her extremities.  Reflexes are 2 in the biceps, brachialis, and triceps.  They are 3 at the quadriceps, 2 at the gastrocnemii. They are symmetrical bilaterally, and toes are downgoing bilaterally.  IMPRESSION: 1. 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 2. 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  There does not  appear to be syringomyelia or syringobulbia.  PLAN:  Patient to be admitted to the neurosurgical intensive care unit for observation and care.  Neuro checks will be done and we will proceed with a ventriculoperitoneal shunt in the morning.  Patient will be made n.p.o. and appropriate laboratories and orders will be written.  Patient will subsequently need a suboccipital craniectomy and upper cervical laminectomy with duraplasty within one to two months.  I have spoken with the patient, her mother, and her brother at length in the emergency room this evening, discussing the nature of her conditions, reviewing them using her MRI scan to demonstrate the nature of the condition, and our recommendations for treatment.  We discussed the nature of the specific procedure and described the ventriculoperitoneal shunting procedure in detail.  We also discussed risks of the surgical procedure, including risks of infection, bleeding, possible need for transfusion; the risks of shunt malfunction due to either infection or mechanical dysfunction; the possible need for shunt revision; the risk of intracranial hemorrhage including the development of subdural hematomas, with possibility of neurologic difficulties including paralysis and coma; anesthetic risks of myocardial infarction, stroke, pneumonia, and death.  It was also explained that her subsequent surgery will be of, at least, a serious nature.  Understanding all this, the patient does wish to proceed with surgery.  She is being admitted to the intensive care unit and will proceed with surgery in the morning. DD:  10/30/99 TD:  10/31/99 Job: [id number]

## 2010-07-31 NOTE — Op Note (Signed)
[place]. [facility]  Patient:    MEAGAN, ANCONA Visit Number: [id number] MRN: [id number]          Service Type: SUR Location: [id number] Attending Physician:  Henrene Dodge Dictated by:   Anselm Pancoast. Zachery Dakins, M.D. Proc. Date: 10/05/01 Admit Date:  10/05/2001 Discharge Date: 10/06/2001   CC:         John C. Madilyn Fireman, M.D., [facility]   Operative Report  PREOPERATIVE DIAGNOSES: 1. Acute cholecystitis with stones. 2. Intrauterine pregnancy, six weeks. 3. History of peritoneal ventricular shunt for Budd-Chiari syndrome.  OPERATION:  Laparoscopic cholecystectomy and cholangiogram with careful examination of the lower abdomen.  SURGEON:  Anselm Pancoast. Zachery Dakins, M.D.  ASSISTANT:  Ollen Gross. Vernell Morgans, M.D.  HISTORY:  The patient is a 35 year old black female who presented to the emergency room at [facility] about 6:30 with severe abdominal pain.  She has had some pain for approximately two days but more intense, and she presented there approximately 6:30 this a.m.  The patient has had nausea and past history of epigastric cramping, abdominal pain that would subside but this pain was more intense, but it was also located a little bit more in the mid-to-lower abdomen, not as well localized as the previous attack she has had.  She was seen by the physician on call there.  An ultrasound was obtained.  Laboratory studies determined her white count was 21,000.  She was not febrile.  Liver tests showed a slightly elevated SGOT and an SGPT.  Amylase and lipase were normal.  Hematocrit was 33.0 and urine did not show any evidence of a urinary tract infection.  The patients pain appeared to subside after she was given some morphine.  I was called to see her being the surgeon on call after the gallstones were identified.  On examination when I saw her, she was certainly not acutely ill and the abdominal pain, she was not having pain in the upper abdomen  then.  It was kind of mildly in the right lower quadrant.  She has got this incision where the ventricular peritoneal shunt had been placed approximately three years for this Budd-Chiari syndrome.  I examined her  and thought that probably she has had a gallbladder attack and possibly could have passed a common duct stone with a little tenderness in the lower abdomen at this time.  Whether or not this was somehow or another related to this ventricular shunt or possibly early appendicitis or what, we are not sure.  In discussion with the patient and her brother and mother, she is not married, the patient states that she really is possibly considering terminating this pregnancy as it was certainly not planned, and she understands the risk of CTs for the pregnancy and also with the ventricular peritoneal shunt.  I talked with the neurosurgeon, and he said that, of course, if she has got a problem with an infected shunt, ______, gangrenous gallbladder or any of these things may cause her problems.  Other preparations will be needed with laparoscopic surgery as it is a definite one-way valve which has cultured the peritoneal fluid.  I discussed with the patient and her family that I would just proceed over the laparoscopic surgery, looking carefully at the appendix at the time of surgery, to make sure this was not an early appendicitis.  They were in agreement.  She was started on 3 g of Unasyn over at the [facility] ER, and then transferred over here  and added to the OR schedule for a couple hours later. The patient received a second 3 g of Unasyn, admission obtained for surgery and taken to the OR suite.  PROCEDURE:  Induction of general anesthesia via an endotracheal tube and ______ to the stomach.  I did place a Foley catheter sterilely in case we needed to do an appendectomy.  The abdomen was prepped with Betadine, surgically scrubbed with solution and draped in a sterile manner.  She has  got a little promise at he umbilicus although she has got a little umbilical hernia, and the original incision was made just below this.  The fascia was identified.  She still kind of had a wax abdomen probably from her previous pregnancy, and a little small opening was carefully made into the peritoneal cavity through the fascia, the upper portion of the lower hernia sac.  I then placed traction sutures of 0 Vicryl.  Hasson cannula was introduced and the camera inserted.  The gallbladder was not acutely distended.  The catheter, of course, definitely does go into the lower abdomen and actually is across the midline and entered into the left lower quadrant although her tenderness was more to the right.  We could visualize the appendix easily and it was not acutely inflamed.  There was no evidence of any abnormality of the left or right tubes.  I did not really dissect the definite tube up.  There was no bloody fluid or evidence of infection.  I did culture the peritoneal fluid. There was a little bit of gelatinous-type thing, but in the catheter, you could see it dripping in.  A culture was obtained and also I asked them to do a gram stain to see if any bacteria or fat.  I do not think this is peritonitis from the appearance of the peritoneal fluid.  We then dissected and went ahead and placed the upper 10 mm trocar, and the two 5 mm trocars were taken with significant care to make sure they were always away from this shunt going in.  The gallbladder was retracted upward and outward, the peritoneum opened up.  The cystic duct was carefully and easily identified as was the cystic artery.  The proximal cystic artery was triple clipped, singly distally and divided, and I placed the clip across the junction of the cystic duct and the common bile duct.  A small opening was made and Cook catheter introduced, cholangiogram obtained.  There is good prompt filling of the extrahepatic biliary  system, but the common bile duct is definitely dilated as if she may have actually passed a common duct stone.  There was good flow into the duodenum.  There was nice tapering of the distal portion with no evidence of any stones in the bile duct.  The catheter was removed.  The cystic duct with a short remnant was triply clipped and then divided, and the gallbladder was freed up and placed in an endocatch bag and good hemostasis obtained.  I thoroughly irrigated and inspected and did not notice any bleeding or bile leak in the upper abdomen, and all the irrigating fluid and etc. was aspirated.  I placed the gallbladder in an endocatch bag. The camera was placed in the upper 10 mm port, and the bag containing the gallbadder was withdrawn through the umbilical fascia.  I then sort of evaginated this little umbilical fascia and closed the area fascia-to-fascia with about four sutures of 0 Vicryl, all figure-of-eights, hoping that _____ I  would able continue to have this little umbilical hernia.  We took pictures of the appendix and the catheter in the lower abdomen.  The one on the gallbladder did not actually print, but the gallbladder grossly did not appear to be significantly ______.  I also took a sample of the bile which did not spill during the surgery.  We forwarded culture to the lab.  The subcutaneous layers were closed with 4-0 Vicryl.  Benzoin and Steri-Strips applied to the skin incision.  We will keep her on antibiotics for another couple of doses.  We will await results of cultures in the morning.  I hope that she will be ready for discharge.  I will repeat her liver functions and CBC in the morning, and hopefully these will have all returned to normal.  I expect that she definitely has gallstones, and I really wonder if she could possibly have passed a common duct stone with this kind of migratory pain, upper abdomen, mid-abdomen, even though she never really complained of  pain going to the small portion of the back which would usually do with common duct stone. Dictated by:   Anselm Pancoast. Zachery Dakins, M.D. Attending Physician:  Henrene Dodge DD:  10/05/01 TD:  10/09/01 Job: [id number]

## 2010-07-31 NOTE — Op Note (Signed)
[name], Meghan Davila               ACCOUNT NO.:  [id number]   MEDICAL RECORD NO.:  [id number]          PATIENT TYPE:  AMB   LOCATION:  DSC                          FACILITY:  [facility]   PHYSICIAN:  Earvin Hansen L. Truesdale, M.D.DATE OF BIRTH:  March 29, 1975   DATE OF PROCEDURE:  12/07/2004  DATE OF DISCHARGE:                                 OPERATIVE REPORT   This is a 35 year old who has severe macromastia, back and shoulder pain  secondary to large, pendulous breasts, intertriginous changes, pitting of  both shoulders.  Has used nonsurgical treatment in the past with no avail,  including a special bra.  She is now being prepared for bilateral breast  reductions using the inferior pedicle technique.   ANESTHESIA:  General.   Preoperatively, the patient was set up and drawn for the inferior pedicle  reduction mammoplasty, re-marking the nipple-areolar complexes 20 cm from  the suprasternal notch.  She then underwent general anesthesia, intubated  orally, prep was done to the chest, breast areas in a routine fashion using  a Betadine soap and solution and walled off with sterile towels and drapes  so as to make a sterile field.  Xylocaine 0.25% with epinephrine 1:400,000  concentration was injected, 100 mL per side.  This was allowed to set up.  Next the wounds were scored with a #15 blade.  Then the skin over the  inferior pedicle was de-epithelialized with a #20 blade.  The medial and  lateral fatty dermal pedicles were excised down to underlying fascia.  After  proper hemostasis, a new keyhole area was also debulked and more tissue  taken laterally in the accessory area of the axillary and the serratus  anterior.  After this, after proper hemostasis, the flaps were transposed  and stayed with 3-0 Prolene.  Subcutaneous closure was done with 3-0  Monocryl x2 layers and a running subcuticular stitch of 3-0 Monocryl with 5-  0 Monocryl throughout the inverted T.  The wounds were drained with a  #10  Blake drain, which was placed in the depths of the wound and brought out  through the lateralmost portion of the incision and secured with 3-0  Prolene.  The wounds were cleansed, Steri-Strips and a soft dressing applied  to both sides.  At the end of the procedure the nipple-areolar complexes  were examined and showing good suppleness and blood supply.  After soft  dressings were placed an Hypafix, the patient was then taken to recovery in  excellent condition.  Estimated blood loss less than 150 mL.  Complications:  None.      Yaakov Guthrie. Shon Hough, M.D.  Electronically Signed     GLT/MEDQ  D:  12/07/2004  T:  12/08/2004  Job:  [id number]

## 2010-07-31 NOTE — Discharge Summary (Signed)
[facility]  Patient:    Meghan Davila, Meghan Davila                        MRN: [id number] Adm. Date:  [id number] Disc. Date: [id number] Attending:  Barton Fanny                           Discharge Summary  ADMISSION HISTORY AND PHYSICAL EXAMINATION:  The patient is a 35 year old right-handed black female who presented to the emergency room with a three week history of bad headaches.  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  There did not appear to be any syringeal bulbi or syringomyelia.  PAST MEDICAL HISTORY:  Unremarkable.  ALLERGIES:  No known drug allergies.  MEDICATIONS:  None.  PHYSICAL EXAMINATION:  GENERAL:  Unremarkable.  NEUROLOGIC EXAMINATION:  The patient was neurologically intact.  HOSPITAL COURSE:  The patient was admitted, and the following day taken to surgery for ventricular peritoneal shunting.  A medium pressure Hakim type shunt was placed from a right frontal ventricular approach, carried subcutaneous to the peritoneum in the right upper quadrant.  The patient tolerated the procedure well.  Computer tomography the following day showed the ventricular catheter in good position with good decompression of the ventricular system.  We gradually raised her head of bed and began p.o. intake, and the patient has made gradual progress.  She has had some nausea and vomiting which has delayed her discharge from the hospital.  Her wounds have healed well.  She is afebrile.  Repeat CT scans of the brain have been done which have shown further decompression of the ventricular system.  The patient has been up and ambulating, and on the day of discharge she is afebrile.  She denies any headache, nausea, or vomiting.  She ate her breakfast without difficulty, and she is being  discharged to home with instructions regarding wound care and activity.  She is to return in 5 to 6 days for staple removal.  We plan on decompressing the Chiari I malformation in 4 to 6 weeks.  DISCHARGE DIAGNOSES: 1. 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 2. Headache. 3. Chiari I malformation. DD:  11/04/99 TD:  11/05/99 Job: [id number]

## 2011-04-30 IMAGING — US US OB NUCHAL TRANSLUCENCY 1ST GEST
1 series · 14 of 24 positions shown · non-contrast
Comparison: none

OBSTETRICAL ULTRASOUND:
 This ultrasound was performed in The [HOSPITAL], and the AS OB/GYN report will be stored to [REDACTED] PACS.  This report is also available in [HOSPITAL]?s accessANYware.

[Series 1: us ob nuchal translucency 1st gest · 14 of 24 slices shown]
[im 1/24]
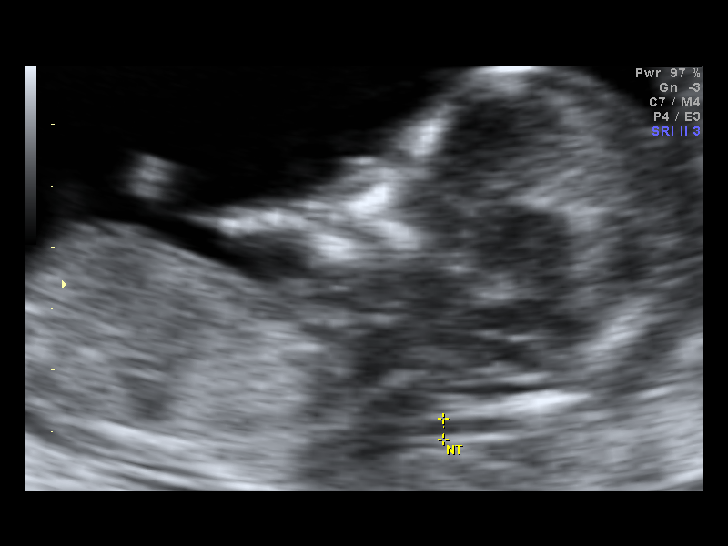
[im 3/24]
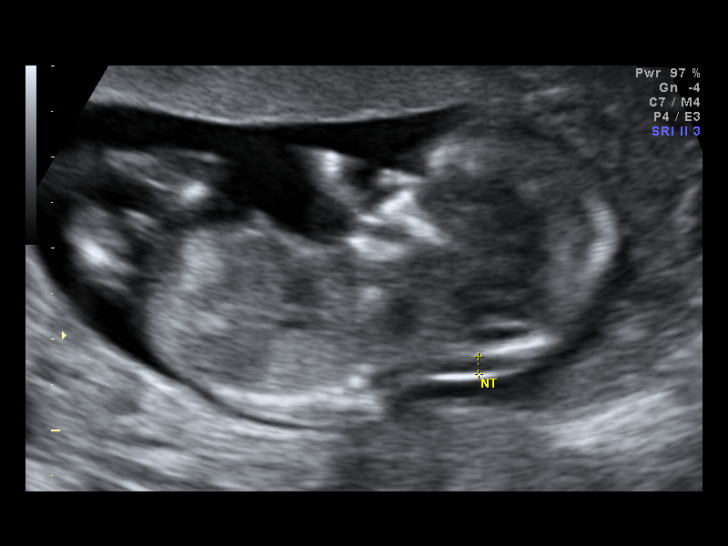
[im 5/24]
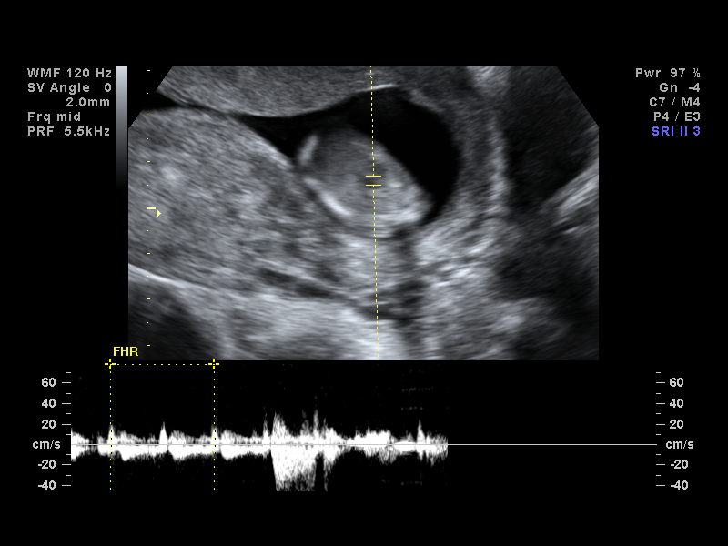
[im 7/24]
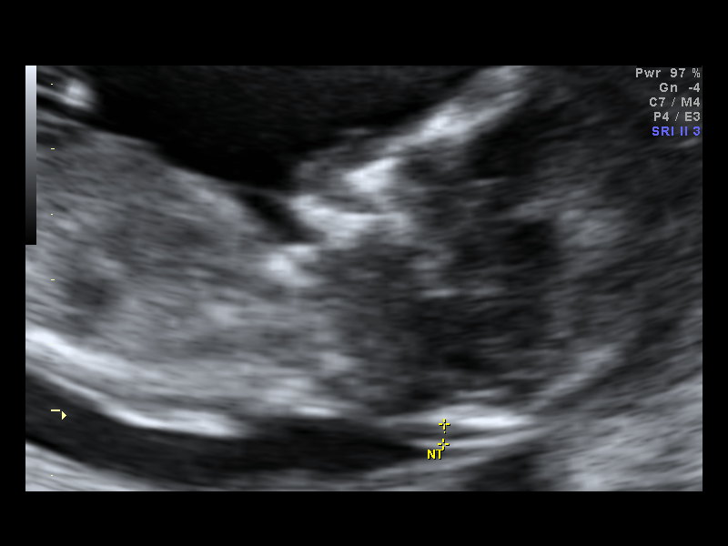
[im 8/24]
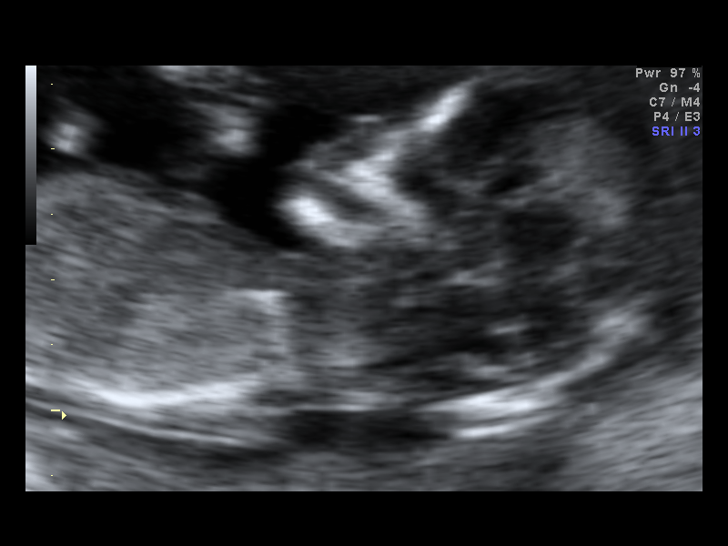
[im 10/24]
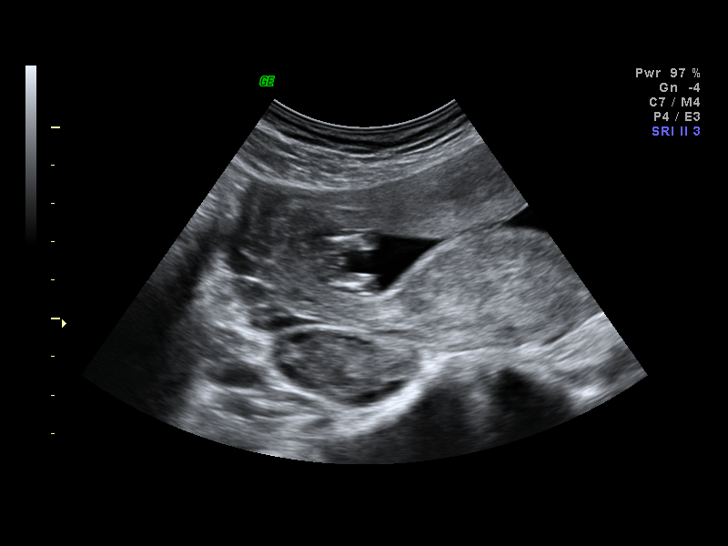
[im 12/24]
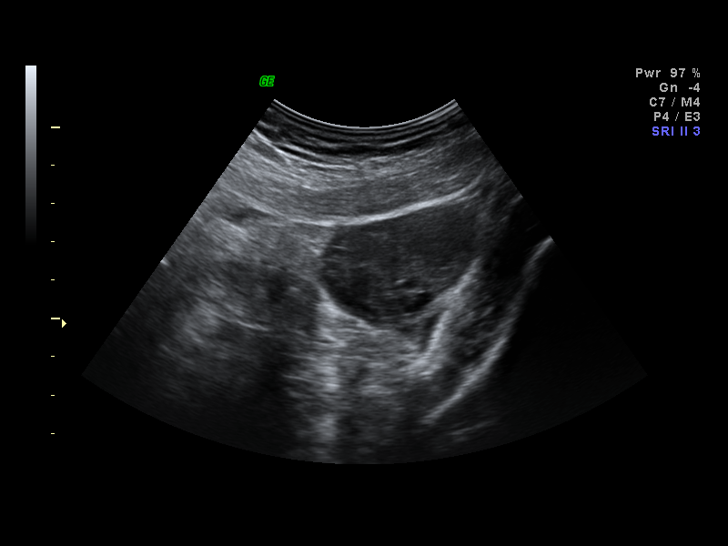
[im 13/24]
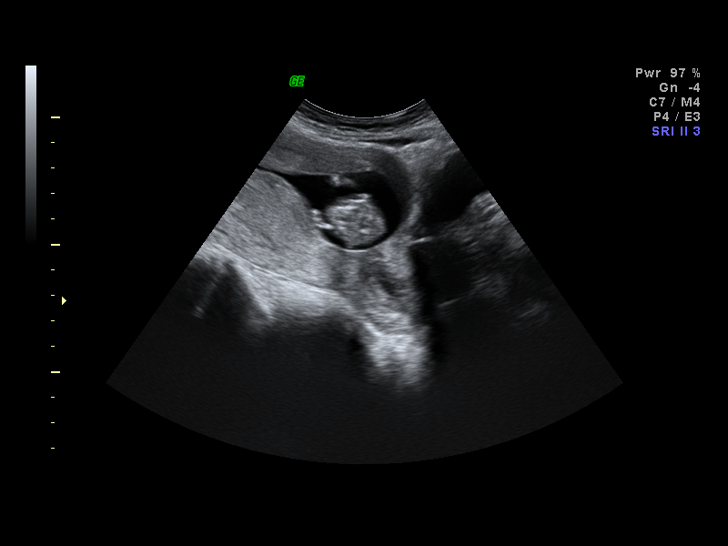
[im 15/24]
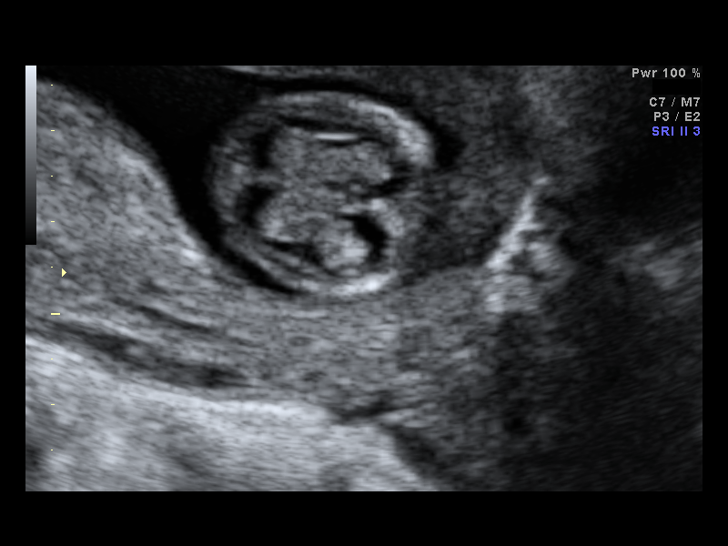
[im 17/24]
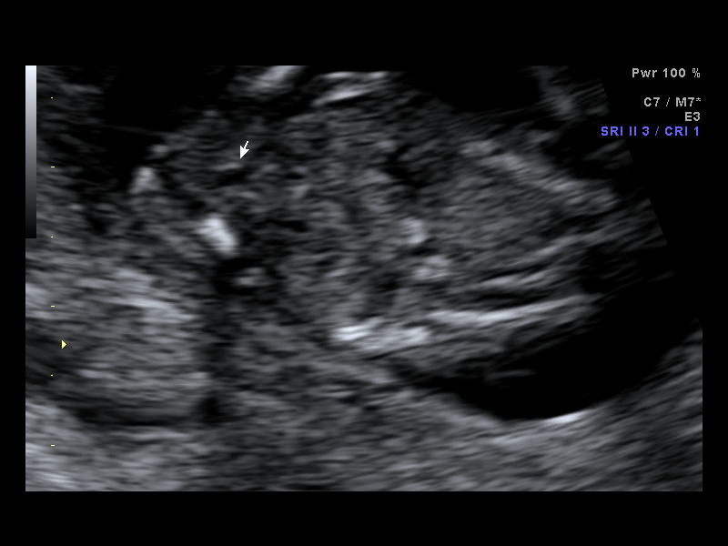
[im 19/24]
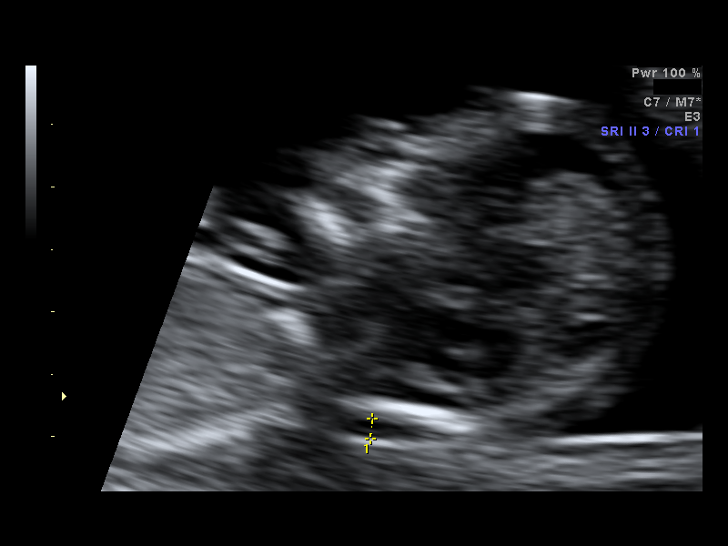
[im 20/24]
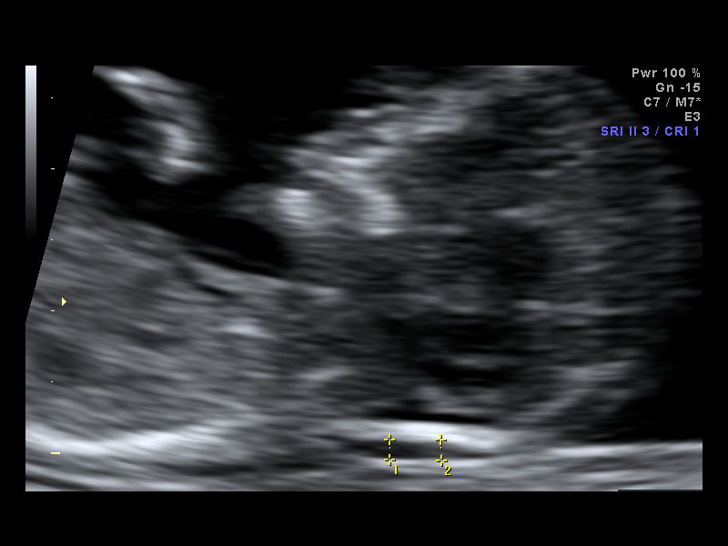
[im 22/24]
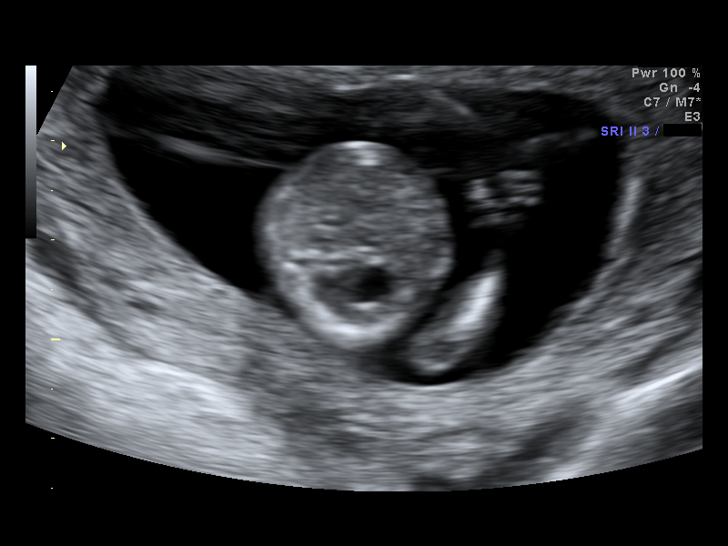
[im 24/24]
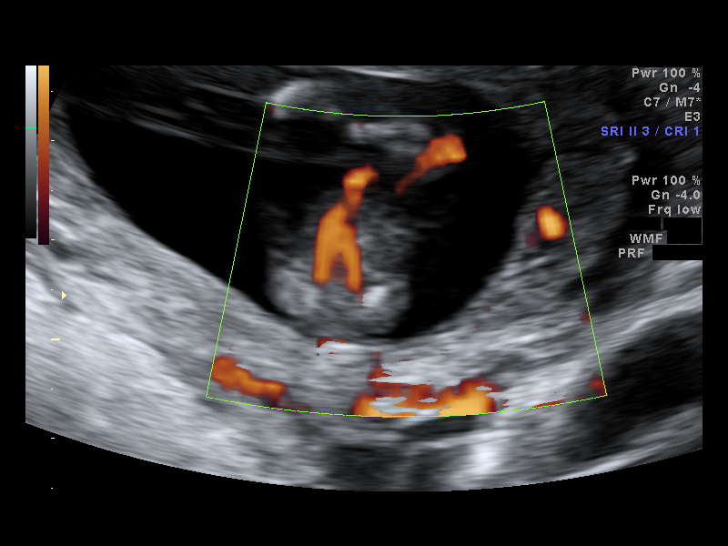

[14 of 24 positions shown; findings below may reference images not displayed]

IMPRESSION: AS OB/GYN has also been faxed to the ordering physician.

## 2011-06-07 IMAGING — US US OB DETAIL+14 WK
2 series · 14 of 28 positions shown · non-contrast
Comparison: none

OBSTETRICAL ULTRASOUND:
 This ultrasound exam was performed in the [HOSPITAL] Ultrasound Department.  The OB US report was generated in the AS system, and faxed to the ordering physician.  This report is also available in [HOSPITAL]?s AccessANYware and in [REDACTED] PACS.

[Series 1: us ob detail +14 wk · 0.12mm/px · 1 of 8 slices shown (1 of 2)]
[im 4/8]
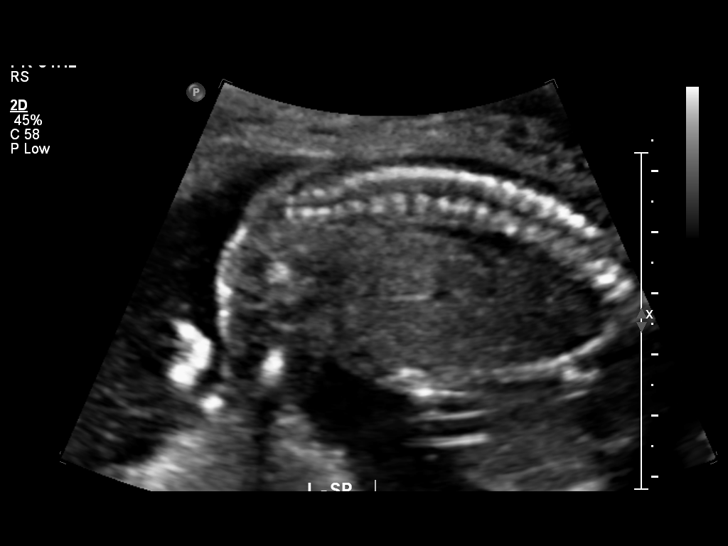

[Series 1: us ob detail +14 wk · 0.24mm/px · 13 of 76 slices shown (2 of 2)]
[im 1/76]
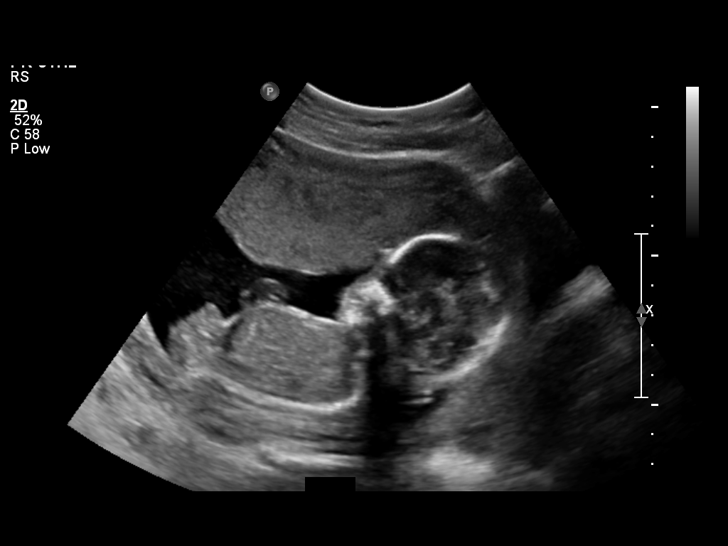
[im 7/76]
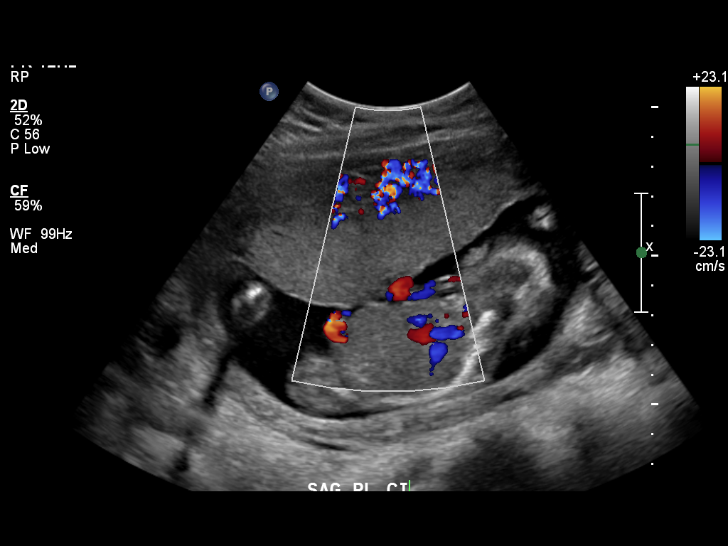
[im 13/76]
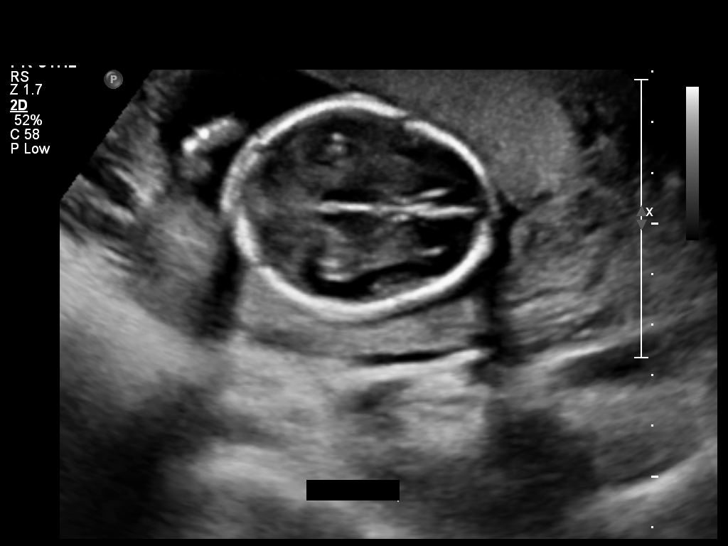
[im 19/76]
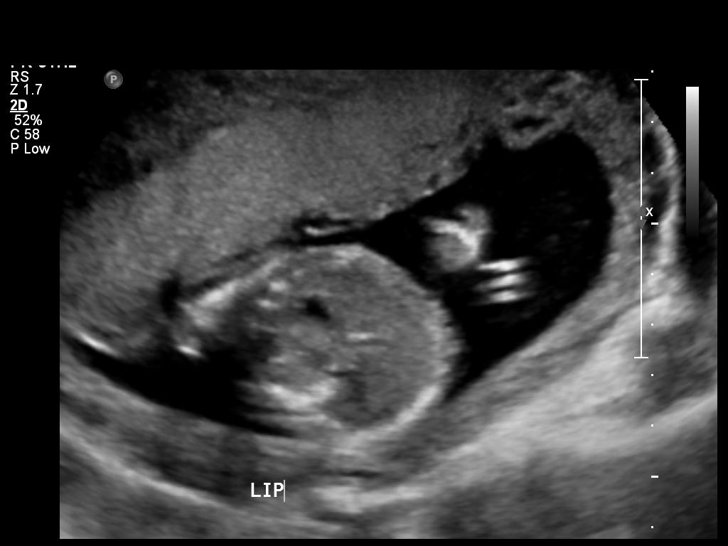
[im 26/76]
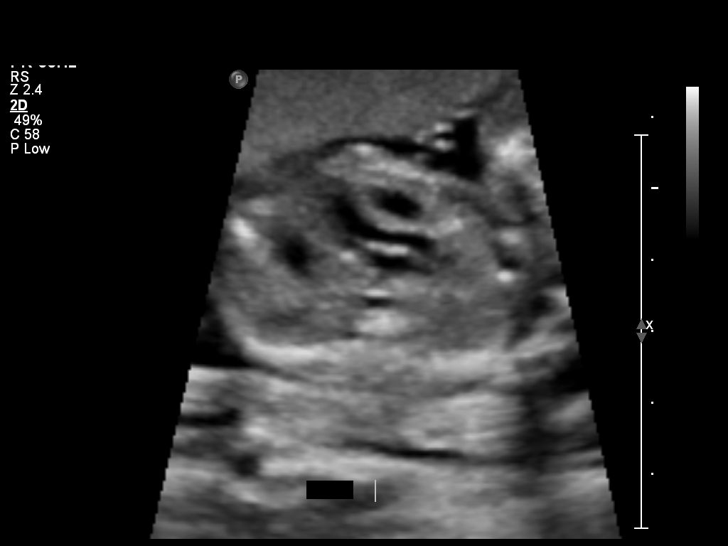
[im 32/76]
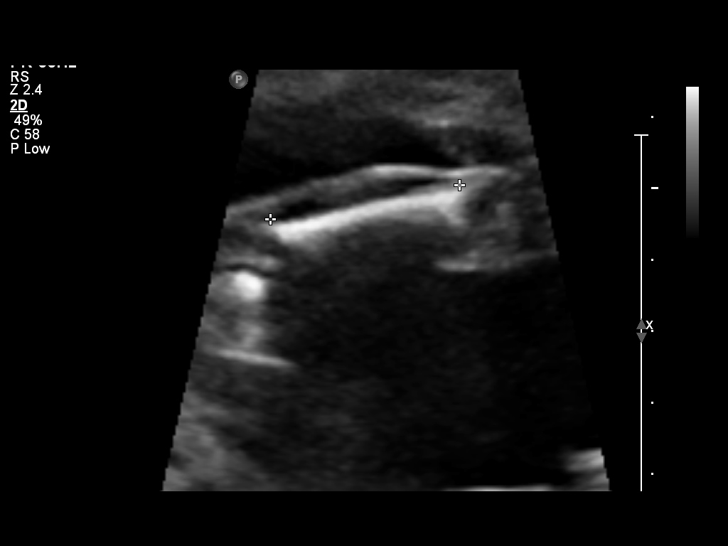
[im 38/76]
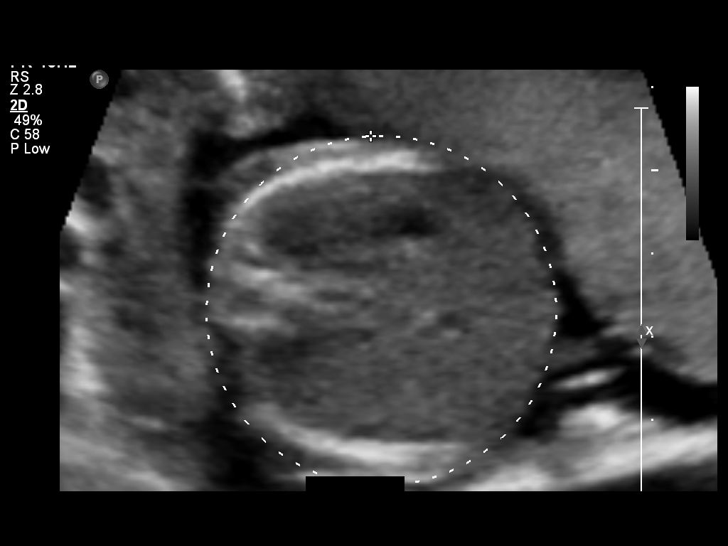
[im 44/76]
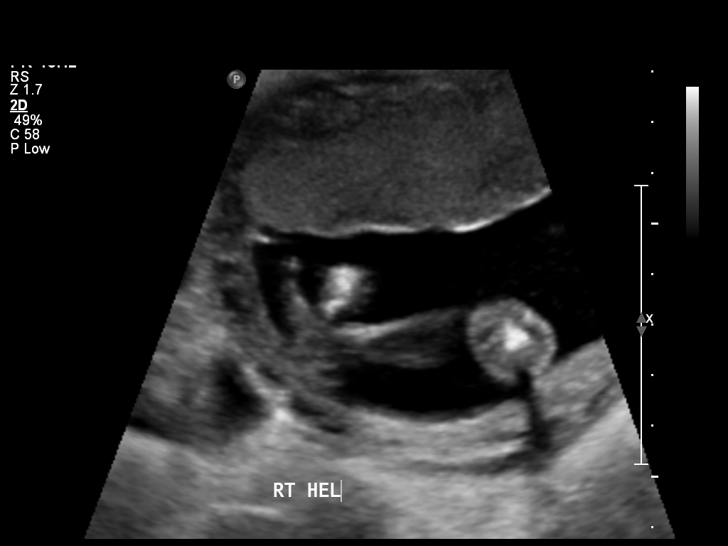
[im 51/76]
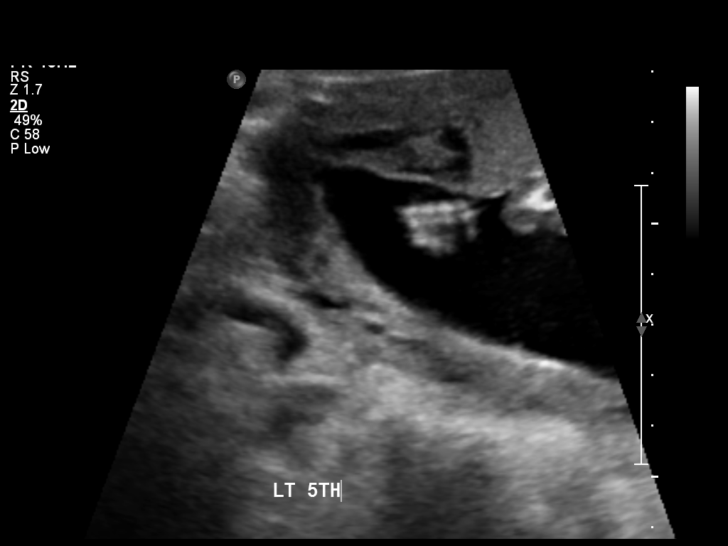
[im 57/76]
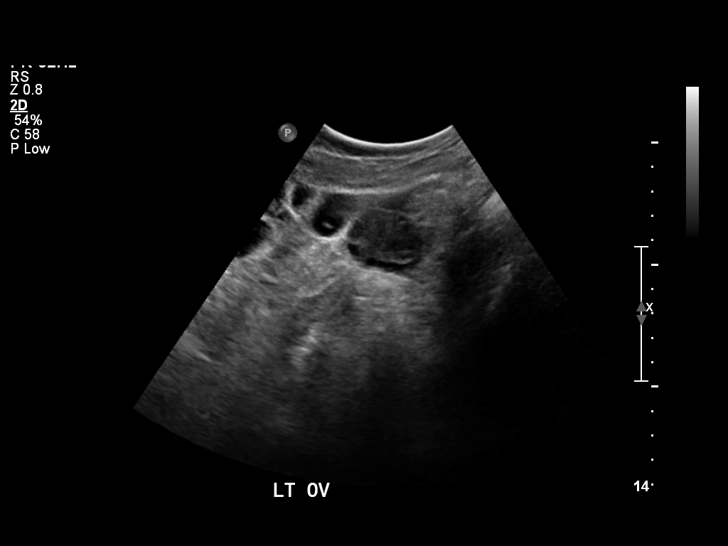
[im 63/76]
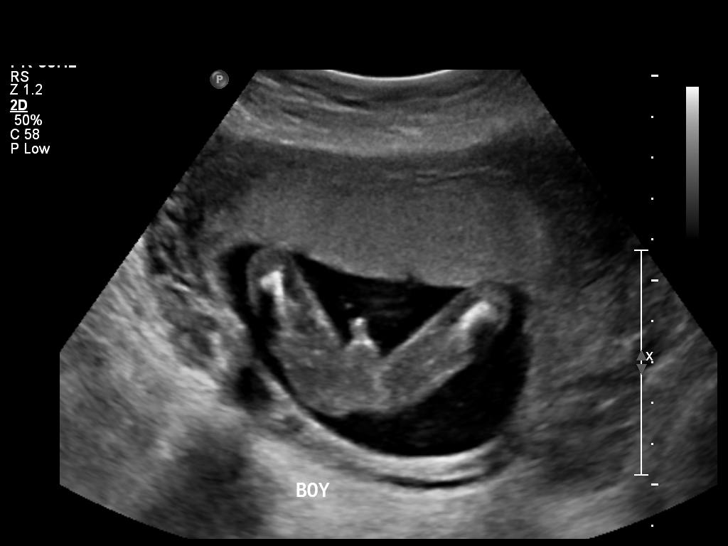
[im 69/76]
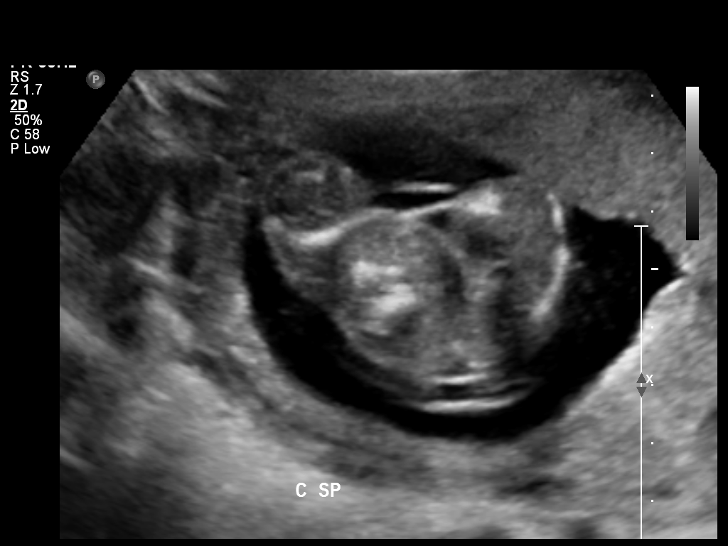
[im 76/76]
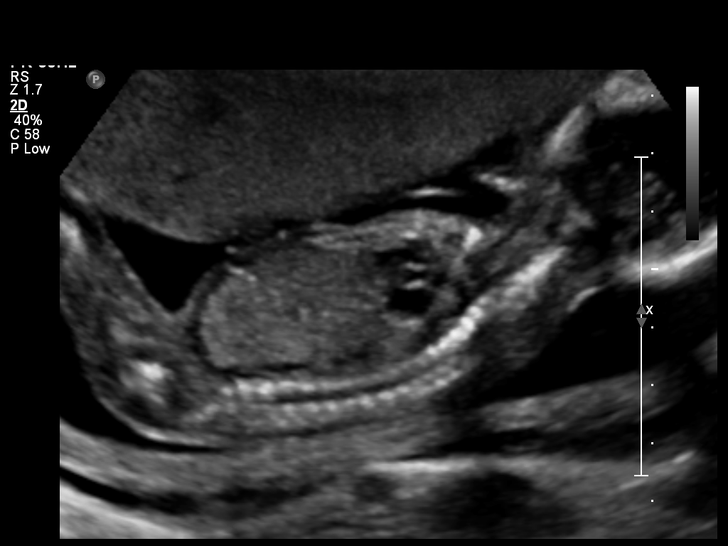

[14 of 28 positions shown; findings below may reference images not displayed]

IMPRESSION: See AS Obstetric US report.

## 2011-09-13 ENCOUNTER — Ambulatory Visit (INDEPENDENT_AMBULATORY_CARE_PROVIDER_SITE_OTHER): Payer: BC Managed Care – PPO | Admitting: Women's Health

## 2011-09-13 ENCOUNTER — Other Ambulatory Visit (HOSPITAL_COMMUNITY)
Admission: RE | Admit: 2011-09-13 | Discharge: 2011-09-13 | Disposition: A | Discharge status: Home / Self Care | Payer: BC Managed Care – PPO | Source: Ambulatory Visit | Attending: Obstetrics and Gynecology | Admitting: Obstetrics and Gynecology

## 2011-09-13 ENCOUNTER — Encounter: Payer: Self-pay | Admitting: Women's Health

## 2011-09-13 VITALS — BP 150/90 | Ht 61.5 in | Wt 143.0 lb

## 2011-09-13 DIAGNOSIS — O24419 Gestational diabetes mellitus in pregnancy, unspecified control: Secondary | ICD-10-CM

## 2011-09-13 DIAGNOSIS — G911 Obstructive hydrocephalus: Secondary | ICD-10-CM

## 2011-09-13 DIAGNOSIS — Z833 Family history of diabetes mellitus: Secondary | ICD-10-CM

## 2011-09-13 DIAGNOSIS — Z01419 Encounter for gynecological examination (general) (routine) without abnormal findings: Secondary | ICD-10-CM | POA: Insufficient documentation

## 2011-09-13 DIAGNOSIS — O99810 Abnormal glucose complicating pregnancy: Secondary | ICD-10-CM

## 2011-09-13 DIAGNOSIS — G919 Hydrocephalus, unspecified: Secondary | ICD-10-CM

## 2011-09-13 DIAGNOSIS — Z309 Encounter for contraceptive management, unspecified: Secondary | ICD-10-CM

## 2011-09-13 DIAGNOSIS — IMO0001 Reserved for inherently not codable concepts without codable children: Secondary | ICD-10-CM

## 2011-09-13 LAB — CBC WITH DIFFERENTIAL/PLATELET
Basophils Absolute: 0.1 10*3/uL (ref 0.0–0.1)
Basophils Relative: 1 % (ref 0–1)
Eosinophils Absolute: 0.3 10*3/uL (ref 0.0–0.7)
Eosinophils Relative: 3 % (ref 0–5)
HCT: 34.3 % — ABNORMAL LOW (ref 36.0–46.0)
Hemoglobin: 11.4 g/dL — ABNORMAL LOW (ref 12.0–15.0)
Lymphocytes Relative: 23 % (ref 12–46)
Lymphs Abs: 2.2 10*3/uL (ref 0.7–4.0)
MCH: 27.5 pg (ref 26.0–34.0)
MCHC: 33.2 g/dL (ref 30.0–36.0)
MCV: 82.7 fL (ref 78.0–100.0)
Monocytes Absolute: 0.9 10*3/uL (ref 0.1–1.0)
Monocytes Relative: 9 % (ref 3–12)
Neutro Abs: 6.4 10*3/uL (ref 1.7–7.7)
Neutrophils Relative %: 64 % (ref 43–77)
Platelets: 338 10*3/uL (ref 150–400)
RBC: 4.15 MIL/uL (ref 3.87–5.11)
RDW: 13.2 % (ref 11.5–15.5)
WBC: 9.9 10*3/uL (ref 4.0–10.5)

## 2011-09-13 LAB — GLUCOSE, RANDOM: Glucose, Bld: 78 mg/dL (ref 70–99)

## 2011-09-13 MED ORDER — MEDROXYPROGESTERONE ACETATE 150 MG/ML IM SUSP: 150.0000 mg | Freq: Once | INTRAMUSCULAR | Status: DC

## 2011-09-13 NOTE — Patient Instructions (Addendum)

## 2011-09-13 NOTE — Progress Notes (Signed)
Meghan Davila 35-Mar-1977 [id number]    History:    The patient presents for annual exam.  Monthly cycles/5-7 days/condoms/same partner. Last Pap 2012 per patient-hx normal paps. Tdap 2011. Complains of knots around navel since last pregnancy in 2011. Would like to discuss contraception options, has used Depo and OCPs in past without problems.    Past medical history, past surgical history, family history and social history were all reviewed and documented in the EPIC chart. 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 35 yo daughter and 55 month old son doing well. Hx GDM and in last pregnancy, currently on no medication.   HTN-mother.    ROS:  A  ROS was performed and pertinent positives and negatives are included in the history.  Exam:  Filed Vitals:   09/13/11 1000  BP: 150/90    General appearance:  Normal Head/Neck:  Normal, without cervical or supraclavicular adenopathy. Thyroid:  Symmetrical, normal in size, without palpable masses or nodularity. Respiratory  Effort:  Normal  Auscultation:  Clear without wheezing or rhonchi Cardiovascular  Auscultation:  Regular rate, without rubs, murmurs or gallops  Edema/varicosities:  Not grossly evident Abdominal  Soft,nontender, without masses, guarding or rebound.  Liver/spleen:  No organomegaly noted  Hernia:  None appreciated/at umbilicus hard 1 cm nodule nonindurated superficial, slightly tender to touch.  Skin  Inspection:  Grossly normal  Palpation:  Grossly normal Neurologic/psychiatric  Orientation:  Normal with appropriate conversation.  Mood/affect:  Normal  Genitourinary    Breasts: Examined lying and sitting.     Right: Without masses, retractions, discharge or axillary adenopathy.     Left: Without masses, retractions, discharge or axillary adenopathy.   Inguinal/mons:  Normal without inguinal adenopathy  External genitalia:  Normal  BUS/Urethra/Skene's glands:  Normal  Bladder:  Normal  Vagina:   Normal  Cervix:  Normal  Uterus: Normal in size, shape and contour.  Midline and mobile  Adnexa/parametria:     Rt: Without masses or tenderness.   Lt: Without masses or tenderness.  Anus and perineum: Normal  Digital rectal exam: Normal sphincter tone without palpated masses or tenderness  Assessment/Plan:  36 y.o.  SBF G2P2  for annual exam with complaint of pain in umbilicus.  Elevated BP: 150/90 Contraception counseling Hx GDM/elevated blood pressures last pregnancy Hx Hydrocephalus: VP shunt 2001, followed by Neurology  Plan: Pap done, new screening guidelines reviewed. CBC, glu, U/A. SBE's, Ca rich diet, daily exercise, and abdominal weight loss encouraged. Contraception options discussed, Mirena IUD, Depo, vasectomy.  Depo Provera prescription, instructed to call with next cycle for nurse visit. Encouraged condoms for first two weeks after first Depo. Weight gain and increased risk for osteoporosis with Depo discussed. Instructed to keep log of BP readings, discussed need for possible HTN medication with primary care, discussed lifestyle changes-sodium reduction and increasing exercise. Discussed if pain would persist after weight loss and exercise to followup with primary care.    Harrington Challenger [facility], 10:51 AM 09/13/2011

## 2011-09-14 LAB — URINALYSIS W MICROSCOPIC + REFLEX CULTURE
Bacteria, UA: NONE SEEN
Bilirubin Urine: NEGATIVE
Casts: NONE SEEN
Crystals: NONE SEEN
Glucose, UA: NEGATIVE mg/dL
Hgb urine dipstick: NEGATIVE
Ketones, ur: NEGATIVE mg/dL
Leukocytes, UA: NEGATIVE
Nitrite: NEGATIVE
Protein, ur: NEGATIVE mg/dL
Specific Gravity, Urine: 1.025 (ref 1.005–1.030)
Squamous Epithelial / LPF: NONE SEEN
Urobilinogen, UA: 0.2 mg/dL (ref 0.0–1.0)
pH: 6.5 (ref 5.0–8.0)

## 2011-09-24 IMAGING — US US FETAL BPP W/O NONSTRESS
1 series · 14 of 17 positions shown · non-contrast
Comparison: none

OBSTETRICAL ULTRASOUND:
 This ultrasound exam was performed in the [HOSPITAL] Ultrasound Department.  The OB US report was generated in the AS system, and faxed to the ordering physician.  This report is also available in [HOSPITAL]?s AccessANYware and in [REDACTED] PACS.

[Series 1: us fetal bpp w/o nonstress · non-contrast · 0.27mm/px · 17 acquisitions, 14 frames shown]
[im 1/17]
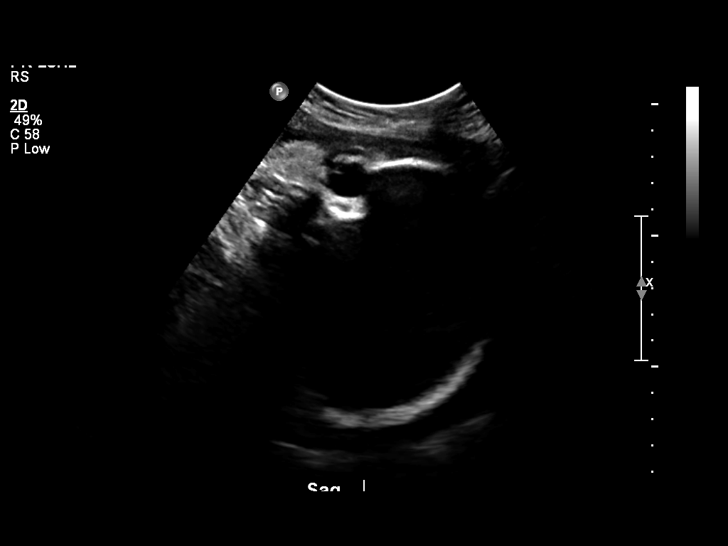
[im 2/17]
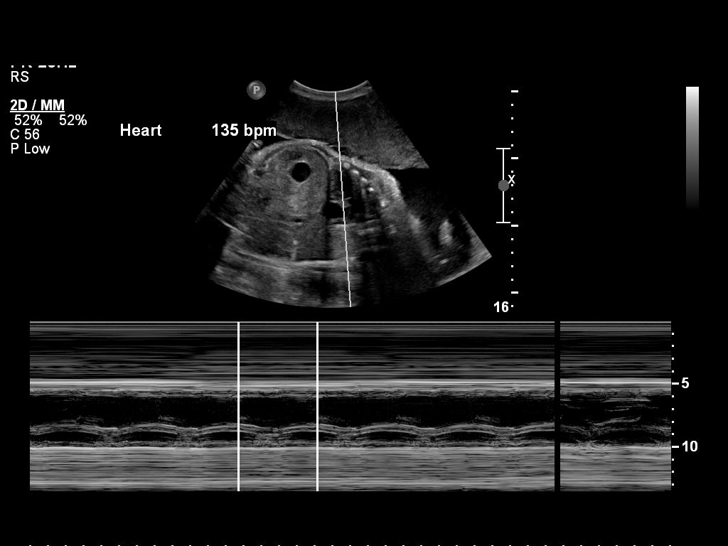
[im 4/17]
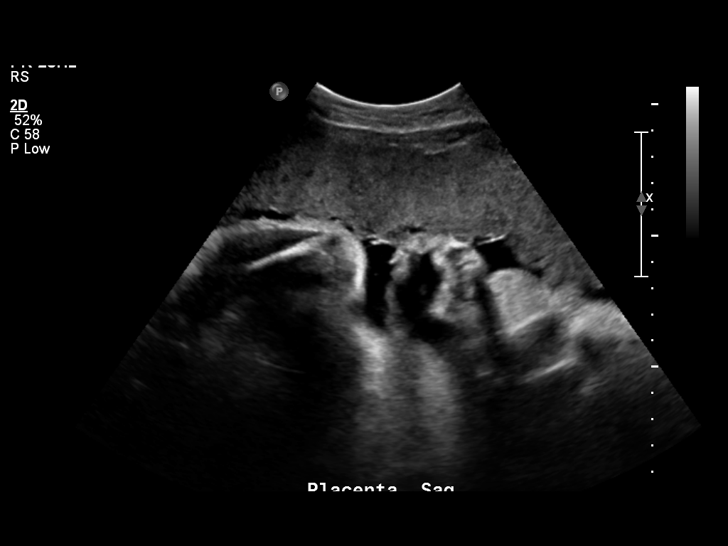
[im 5/17]
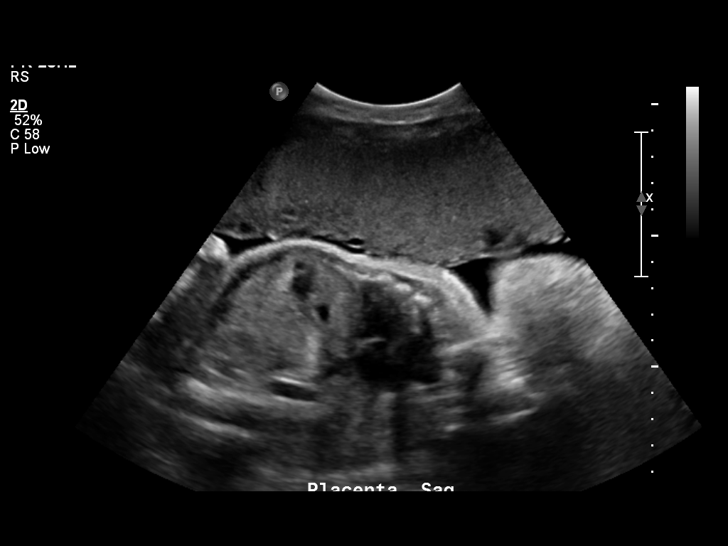
[im 6/17]
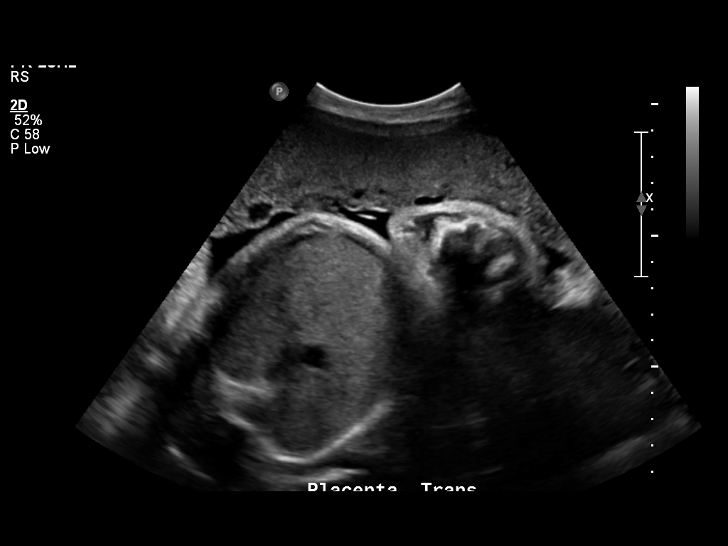
[im 7/17]
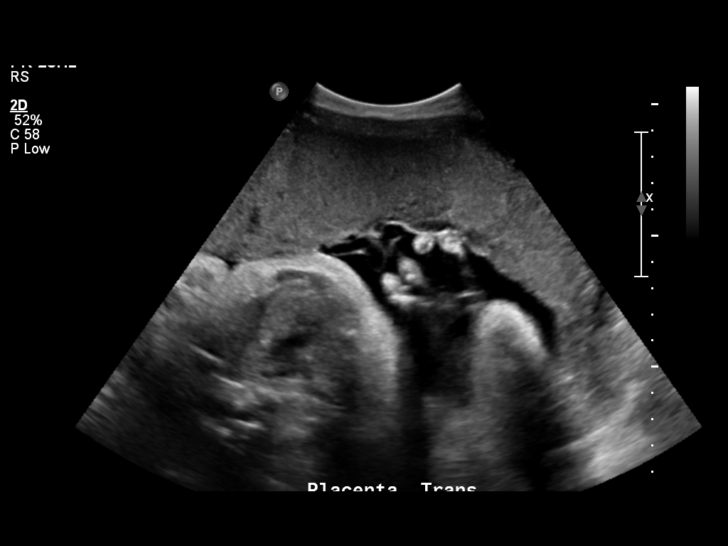
[im 8/17]
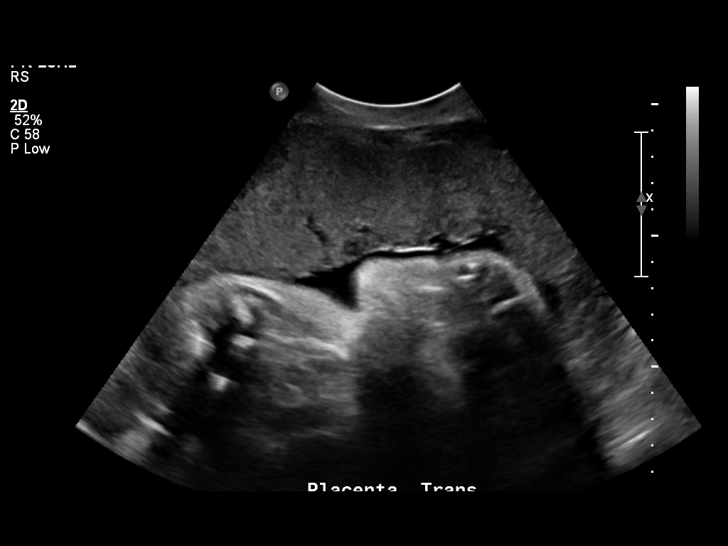
[im 10/17]
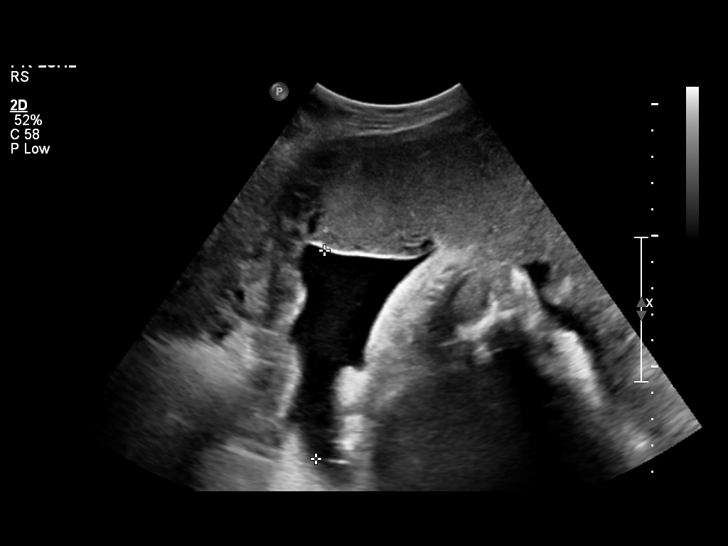
[im 11/17]
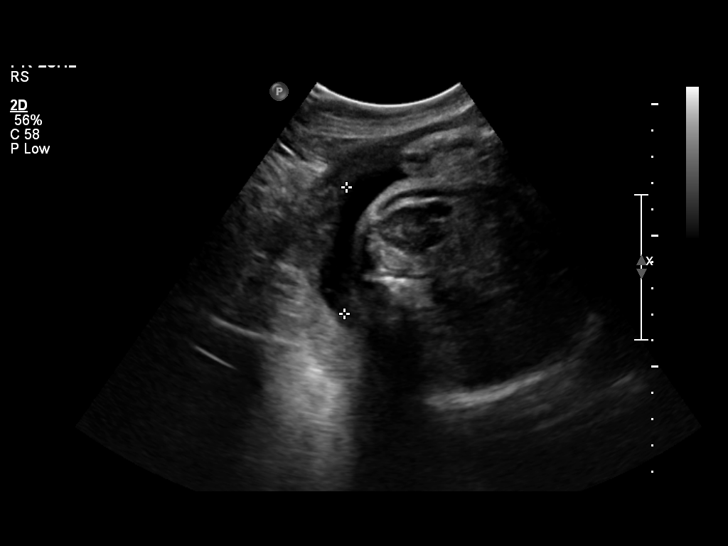
[im 12/17]
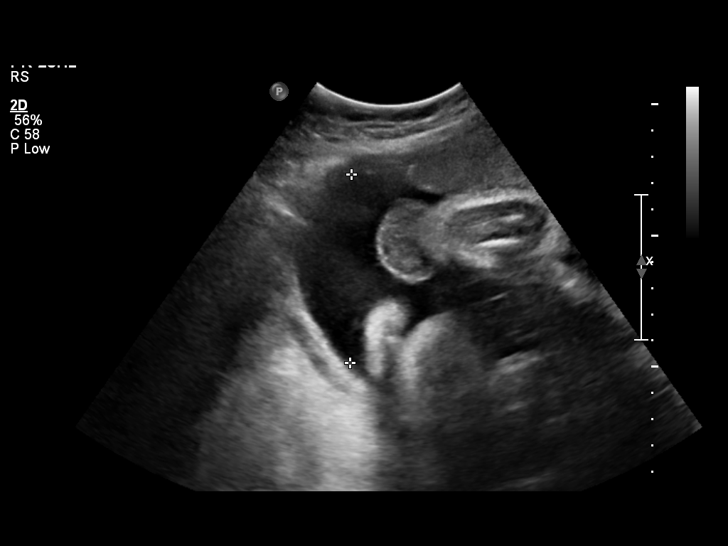
[im 13/17]
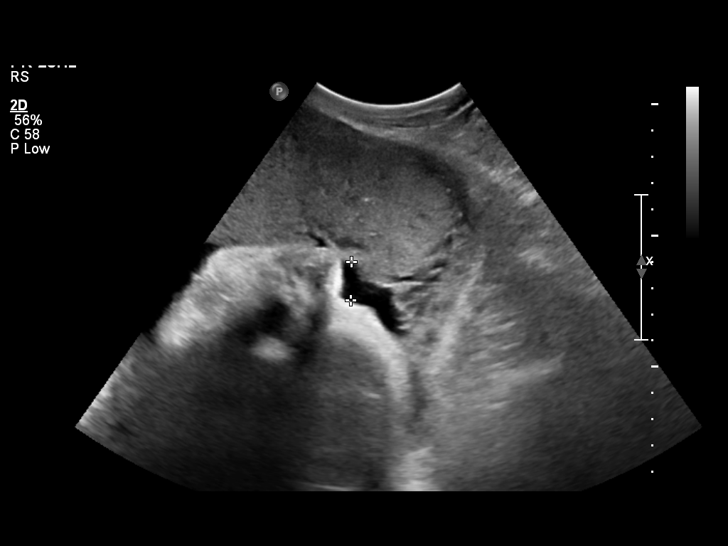
[im 14/17]
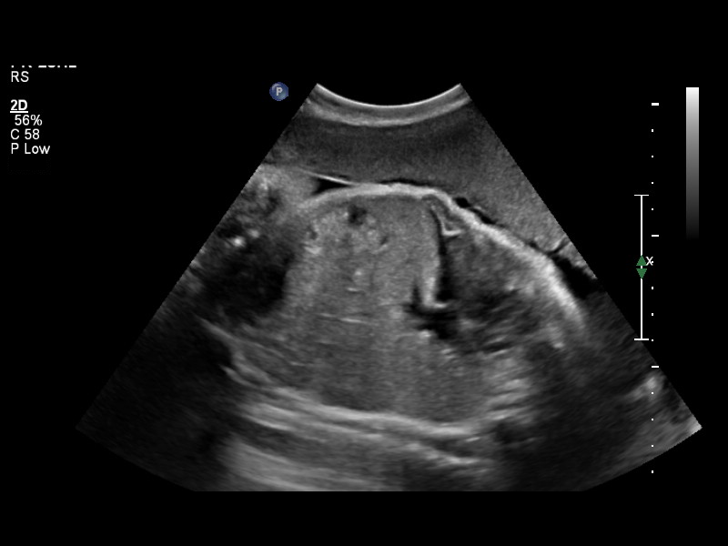
[im 16/17]
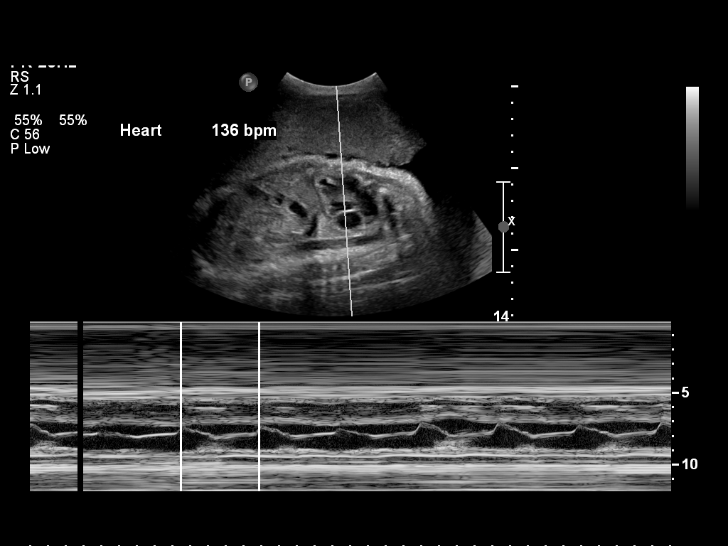
[im 17/17]
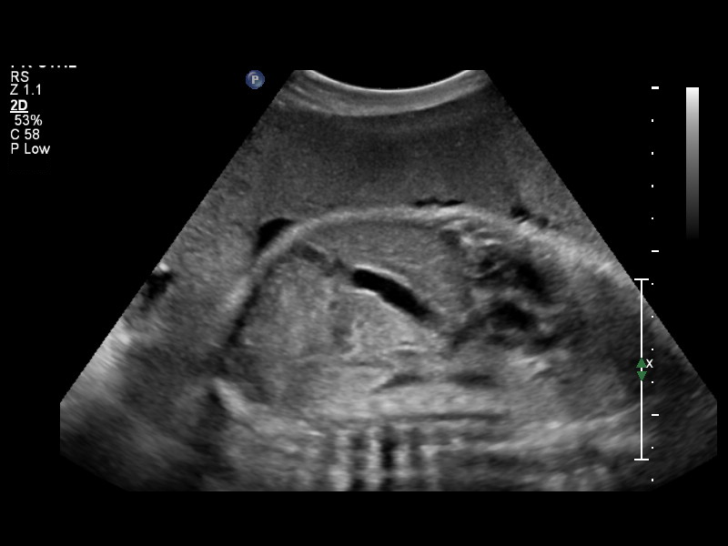

[14 of 17 positions shown; findings below may reference images not displayed]

IMPRESSION: See AS Obstetric US report.

## 2011-09-28 ENCOUNTER — Ambulatory Visit (INDEPENDENT_AMBULATORY_CARE_PROVIDER_SITE_OTHER): Payer: BC Managed Care – PPO | Admitting: *Deleted

## 2011-09-28 DIAGNOSIS — Z3049 Encounter for surveillance of other contraceptives: Secondary | ICD-10-CM

## 2011-09-28 MED ORDER — MEDROXYPROGESTERONE ACETATE 150 MG/ML IM SUSP
150.0000 mg | Freq: Once | INTRAMUSCULAR | Status: AC
  Administered 2011-09-28: 150 mg via INTRAMUSCULAR

## 2011-12-24 ENCOUNTER — Ambulatory Visit (INDEPENDENT_AMBULATORY_CARE_PROVIDER_SITE_OTHER): Payer: BC Managed Care – PPO | Admitting: Anesthesiology

## 2011-12-24 DIAGNOSIS — Z309 Encounter for contraceptive management, unspecified: Secondary | ICD-10-CM

## 2011-12-24 DIAGNOSIS — IMO0001 Reserved for inherently not codable concepts without codable children: Secondary | ICD-10-CM

## 2011-12-24 MED ORDER — MEDROXYPROGESTERONE ACETATE 150 MG/ML IM SUSP
150.0000 mg | Freq: Once | INTRAMUSCULAR | Status: AC
  Administered 2011-12-24: 150 mg via INTRAMUSCULAR

## 2012-03-24 ENCOUNTER — Ambulatory Visit (INDEPENDENT_AMBULATORY_CARE_PROVIDER_SITE_OTHER): Payer: BC Managed Care – PPO | Admitting: Gynecology

## 2012-03-24 DIAGNOSIS — Z309 Encounter for contraceptive management, unspecified: Secondary | ICD-10-CM

## 2012-03-24 DIAGNOSIS — IMO0001 Reserved for inherently not codable concepts without codable children: Secondary | ICD-10-CM

## 2012-03-24 MED ORDER — MEDROXYPROGESTERONE ACETATE 150 MG/ML IM SUSP
150.0000 mg | Freq: Once | INTRAMUSCULAR | Status: AC
  Administered 2012-03-24: 150 mg via INTRAMUSCULAR

## 2012-06-22 ENCOUNTER — Ambulatory Visit (INDEPENDENT_AMBULATORY_CARE_PROVIDER_SITE_OTHER): Payer: BC Managed Care – PPO | Admitting: *Deleted

## 2012-06-22 DIAGNOSIS — Z3049 Encounter for surveillance of other contraceptives: Secondary | ICD-10-CM

## 2012-06-22 MED ORDER — MEDROXYPROGESTERONE ACETATE 150 MG/ML IM SUSP
150.0000 mg | Freq: Once | INTRAMUSCULAR | Status: AC
  Administered 2012-06-22: 150 mg via INTRAMUSCULAR

## 2012-07-18 ENCOUNTER — Ambulatory Visit (INDEPENDENT_AMBULATORY_CARE_PROVIDER_SITE_OTHER): Payer: BC Managed Care – PPO | Admitting: Surgery

## 2012-07-18 ENCOUNTER — Telehealth (INDEPENDENT_AMBULATORY_CARE_PROVIDER_SITE_OTHER): Payer: Self-pay | Admitting: General Surgery

## 2012-07-18 ENCOUNTER — Encounter (INDEPENDENT_AMBULATORY_CARE_PROVIDER_SITE_OTHER): Payer: Self-pay | Admitting: Surgery

## 2012-07-18 VITALS — BP 140/90 | HR 74 | Temp 96.3°F | Resp 16 | Ht 61.0 in | Wt 149.0 lb

## 2012-07-18 DIAGNOSIS — R1033 Periumbilical pain: Secondary | ICD-10-CM | POA: Insufficient documentation

## 2012-07-18 NOTE — Telephone Encounter (Signed)
Spoke with patient she is aware of appt at 07/21/12 @8 :45 am she was instructed on contrast and NPO after midnight

## 2012-07-18 NOTE — Patient Instructions (Addendum)
I can't feel a definite hernia today but there may be one there. We will get a CT scan to see if we can determine that

## 2012-07-18 NOTE — Progress Notes (Signed)
NAME: Meghan Davila DOB: 05-11-1975 MRN: [id number]                                                                                      DATE: 07/18/2012  PCP: Thora Lance, MD Referring Provider: No ref. provider found  IMPRESSION:  Possible umbilical hernia with symptoms to suggest this but not one that I can detect today on physical examination  PLAN:   due to limited abdominal CT scan to see if there is a hernia present or some other etiology for her bulge and discomfort in the umbilical area                 CC:  Chief Complaint  Patient presents with  . Umbilical Hernia    eval umb hernia    HPI:  Meghan Davila is a 37 y.o.  female who presents for evaluation of possible umbilical hernia. She's had about 2 years worth of pain in the umbilical area with intermittent small bulge that comes out just above her umbilicus. It always reduces easily. It is sometimes more painful than others.is not associated with any other GI symptoms. Her primary care physician's note indicated that he could feel a small hernia. She can demonstrate a bulge today. Of note is that she's had a prior cholecystectomy by Dr. Consuello Bossier. Prior to that she had a ventriculoperitoneal shunt placed.  PMH:  has a past medical history of Gestational diabetes and PIH (pregnancy induced hypertension).  PSH:   has past surgical history that includes Brain surgery (2001); Breast reduction surgery (2008); Cholecystectomy (2004); Cesarean section (10/22/09); Cesarean section (2002); and Breast surgery (Bilateral, 2008).  ALLERGIES:  No Known Allergies  MEDICATIONS: Current outpatient prescriptions:medroxyPROGESTERone (DEPO-PROVERA) 150 MG/ML injection, Inject 1 mL (150 mg total) into the muscle once., Disp: 1 mL, Rfl: 4;  sertraline (ZOLOFT) 50 MG tablet, , Disp: , Rfl:   ROS: She has filled out our 12 point review of systems and it is negative. EXAM:   Vital signs:BP 140/90  Pulse 74  Temp(Src) 96.3 F (35.7  C) (Temporal)  Resp 16  Ht 5\' 1"  (1.549 m)  Wt 149 lb (67.586 kg)  BMI 28.17 kg/m2  SpO2 99% General: The patient alert oriented healthy-appearing Abdomen: Nondistended, something of a hypertrophic scar at the umbilicus. Abdomen is soft and nontender with no guarding. She appears to have something of a diastases recti. I cannot detect a definite umbilical hernia despite having her strain and stand. However the sheet describes a bulge just above her umbilicus that sounds typical for a small umbilical hernia. This is intermittent and not apparent today however  DATA REVIEWED:  I have reviewed notes in the stateelectronic medical record as well as notes from her primary physician    Kenzo Ozment J 07/18/2012  CC: No ref. provider found, Thora Lance, MD

## 2012-07-20 ENCOUNTER — Telehealth (INDEPENDENT_AMBULATORY_CARE_PROVIDER_SITE_OTHER): Payer: Self-pay

## 2012-07-20 DIAGNOSIS — R1033 Periumbilical pain: Secondary | ICD-10-CM

## 2012-07-20 NOTE — Telephone Encounter (Signed)
[facility] calling b/c the pt is scheduled tomorrow to have a limited CT Abdomen. [facility] really doesn't do many of the limited CT b/c they seem to come back with a problem then requiring a full CT of the Abdomen to be done on the pt. [facility] is asking if we can just cancel the order on the limited CT and reorder a full CT of Abdomen w/oral contrast only no IV contrast. [facility] is aware that they have already called once to ask about the limited order. Please advise.

## 2012-07-20 NOTE — Addendum Note (Signed)
Addended byLiliana Cline on: 07/20/2012 11:26 AM   Modules accepted: Orders

## 2012-07-20 NOTE — Telephone Encounter (Addendum)
Ok per Dr Jamey Ripa. New order placed in EPIC. Carney Bern with [facility] made aware.

## 2012-07-21 ENCOUNTER — Inpatient Hospital Stay: Admission: RE | Admit: 2012-07-21 | Payer: BC Managed Care – PPO | Source: Ambulatory Visit

## 2012-09-18 ENCOUNTER — Other Ambulatory Visit: Payer: Self-pay | Admitting: Women's Health

## 2012-09-20 ENCOUNTER — Encounter: Payer: Self-pay | Admitting: *Deleted

## 2012-09-20 ENCOUNTER — Ambulatory Visit (INDEPENDENT_AMBULATORY_CARE_PROVIDER_SITE_OTHER): Payer: BC Managed Care – PPO | Admitting: *Deleted

## 2012-09-20 DIAGNOSIS — Z3049 Encounter for surveillance of other contraceptives: Secondary | ICD-10-CM

## 2012-09-20 MED ORDER — MEDROXYPROGESTERONE ACETATE 150 MG/ML IM SUSP
150.0000 mg | Freq: Once | INTRAMUSCULAR | Status: AC
  Administered 2012-09-20: 150 mg via INTRAMUSCULAR

## 2012-11-10 ENCOUNTER — Encounter: Payer: BC Managed Care – PPO | Admitting: Women's Health

## 2012-12-20 ENCOUNTER — Ambulatory Visit (INDEPENDENT_AMBULATORY_CARE_PROVIDER_SITE_OTHER): Payer: BC Managed Care – PPO | Admitting: Women's Health

## 2012-12-20 ENCOUNTER — Encounter: Payer: Self-pay | Admitting: Women's Health

## 2012-12-20 VITALS — BP 122/72 | Ht 61.25 in | Wt 146.4 lb

## 2012-12-20 DIAGNOSIS — Z309 Encounter for contraceptive management, unspecified: Secondary | ICD-10-CM

## 2012-12-20 DIAGNOSIS — IMO0001 Reserved for inherently not codable concepts without codable children: Secondary | ICD-10-CM

## 2012-12-20 DIAGNOSIS — Z3049 Encounter for surveillance of other contraceptives: Secondary | ICD-10-CM

## 2012-12-20 DIAGNOSIS — Z01419 Encounter for gynecological examination (general) (routine) without abnormal findings: Secondary | ICD-10-CM

## 2012-12-20 MED ORDER — MEDROXYPROGESTERONE ACETATE 150 MG/ML IM SUSP: INTRAMUSCULAR | Status: DC

## 2012-12-20 MED ORDER — MEDROXYPROGESTERONE ACETATE 150 MG/ML IM SUSP
150.0000 mg | Freq: Once | INTRAMUSCULAR | Status: AC
  Administered 2012-12-20: 150 mg via INTRAMUSCULAR

## 2012-12-20 NOTE — Addendum Note (Signed)
Addended by: Richardson Chiquito on: 12/20/2012 04:31 PM   Modules accepted: Orders

## 2012-12-20 NOTE — Progress Notes (Signed)
Meghan Davila 04/23/1975 [id number]    History:    The patient presents for annual exam.  Amenorrheic on Depo-Provera/same partner. Normal Pap history. Currently on Zoloft for anxiety and depression per primary care with which she states has relieved many of her symptoms. Reports labs normal at primary care.   Past medical history, past surgical history, family history and social history were all reviewed and documented in the EPIC chart. 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 Gestational diabetes with one pregnancy. Breast reduction 2008. Cholecystectomy 2004,  Brain shunt 2001, headache free. Mother, sister hypertension   ROS:  A  ROS was performed and pertinent positives and negatives are included in the history.  Exam:  Filed Vitals:   12/20/12 0907  BP: 122/72    General appearance:  Normal Head/Neck:  Normal, without cervical or supraclavicular adenopathy. Thyroid:  Symmetrical, normal in size, without palpable masses or nodularity. Respiratory  Effort:  Normal  Auscultation:  Clear without wheezing or rhonchi Cardiovascular  Auscultation:  Regular rate, without rubs, murmurs or gallops  Edema/varicosities:  Not grossly evident Abdominal  Soft,nontender, without masses, guarding or rebound.  Liver/spleen:  No organomegaly noted  Hernia:  None appreciated  Skin  Inspection:  Grossly normal  Palpation:  Grossly normal Neurologic/psychiatric  Orientation:  Normal with appropriate conversation.  Mood/affect:  Normal  Genitourinary    Breasts: Breast reduction    Right: Without masses, retractions, discharge or axillary adenopathy.     Left: Without masses, retractions, discharge or axillary adenopathy.   Inguinal/mons:  Normal without inguinal adenopathy  External genitalia:  Normal  BUS/Urethra/Skene's glands:  Normal  Bladder:  Normal  Vagina:  Normal  Cervix:  Normal  Uterus:   normal in size, shape and contour.  Midline and  mobile  Adnexa/parametria:     Rt: Without masses or tenderness.   Lt: Without masses or tenderness.  Anus and perineum: Normal  Digital rectal exam: Normal sphincter tone without palpated masses or tenderness  Assessment/Plan:  37 y.o. SBF G2P2 for annual exam with no complaints.  Normal GYN exam on Depo-Provera Anxiety/depression stable on Zoloft per primary care  Plan: Depo-Provera 150 every 12 weeks, prescription, proper use given and reviewed importance of calcium rich diet and supplement while on Depo-Provera. SBE's, regular exercise encouraged. No pap, Pap normal 2013, new screening guidelines reviewed.       Harrington Challenger WHNP, 10:08 AM 12/20/2012

## 2012-12-20 NOTE — Patient Instructions (Signed)

## 2012-12-21 LAB — URINALYSIS W MICROSCOPIC + REFLEX CULTURE
Bilirubin Urine: NEGATIVE
Casts: NONE SEEN
Crystals: NONE SEEN
Glucose, UA: NEGATIVE mg/dL
Hgb urine dipstick: NEGATIVE
Ketones, ur: NEGATIVE mg/dL
Leukocytes, UA: NEGATIVE
Nitrite: NEGATIVE
Protein, ur: NEGATIVE mg/dL
Specific Gravity, Urine: 1.025 (ref 1.005–1.030)
Urobilinogen, UA: 1 mg/dL (ref 0.0–1.0)
pH: 7 (ref 5.0–8.0)

## 2012-12-24 LAB — URINE CULTURE: Colony Count: >=100000

## 2012-12-25 ENCOUNTER — Other Ambulatory Visit: Payer: Self-pay | Admitting: Women's Health

## 2012-12-25 MED ORDER — SULFAMETHOXAZOLE-TMP DS 800-160 MG PO TABS: 1.0000 | ORAL_TABLET | Freq: Two times a day (BID) | ORAL | Status: DC

## 2013-03-21 ENCOUNTER — Ambulatory Visit (INDEPENDENT_AMBULATORY_CARE_PROVIDER_SITE_OTHER): Payer: BC Managed Care – PPO | Admitting: Anesthesiology

## 2013-03-21 DIAGNOSIS — Z309 Encounter for contraceptive management, unspecified: Secondary | ICD-10-CM

## 2013-03-21 MED ORDER — MEDROXYPROGESTERONE ACETATE 150 MG/ML IM SUSP
150.0000 mg | Freq: Once | INTRAMUSCULAR | Status: AC
  Administered 2013-03-21: 150 mg via INTRAMUSCULAR

## 2013-05-28 ENCOUNTER — Emergency Department (HOSPITAL_COMMUNITY)
Admission: EM | Admit: 2013-05-28 | Discharge: 2013-05-28 | Disposition: A | Discharge status: Home / Self Care | Payer: BC Managed Care – PPO | Source: Home / Self Care

## 2013-05-28 ENCOUNTER — Encounter (HOSPITAL_COMMUNITY): Payer: Self-pay | Admitting: Emergency Medicine

## 2013-05-28 DIAGNOSIS — M722 Plantar fascial fibromatosis: Secondary | ICD-10-CM

## 2013-05-28 NOTE — ED Provider Notes (Signed)
CSN: [id number]     Arrival date & time 05/28/13  1015 History   First MD Initiated Contact with Patient 05/28/13 1131     No chief complaint on file.  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 The upper quadrants the pain is in the plantar heel. The worst pain is when she first plantar foot in the morning. Denies any known injury. No pain or tenderness to the ankle or dorsum of the foot.   Past Medical History  Diagnosis Date  . Gestational diabetes   . PIH (pregnancy induced hypertension)    Past Surgical History  Procedure Laterality Date  . Brain surgery  2001    shunt put in  . Breast reduction surgery  2008  . Cholecystectomy  2004  . Cesarean section  10/22/09  . Cesarean section  2002  . Breast surgery Bilateral 2008    reduction   Family History  Problem Relation Age of Onset  . Hypertension Mother   . Hypertension Sister   . Hypertension Maternal Grandmother   . Cancer Maternal Grandmother   . Hypertension Maternal Grandfather   . Diabetes Maternal Grandfather    History  Substance Use Topics  . Smoking status: Light Tobacco Smoker    Types: Cigarettes  . Smokeless tobacco: Never Used     Comment: smokes 2 cigs daily  . Alcohol Use: No   OB History   Grav Para Term Preterm Abortions TAB SAB Ect Mult Living   2 2        2      Review of Systems  Constitutional: Negative for fever, chills and activity change.  HENT: Negative.   Respiratory: Negative.   Cardiovascular: Negative.   Musculoskeletal:       As per HPI  Skin: Negative for color change and pallor.  Neurological: Negative.     Allergies  Review of patient's allergies indicates no known allergies.  Home Medications   Current Outpatient Rx  Name  Route  Sig   Dispense  Refill  . medroxyPROGESTERone (DEPO-PROVERA) 150 MG/ML injection      INJECT 1 ML INTO THE MUSCLE ONCE   1 mL   4   . sertraline (ZOLOFT) 50 MG tablet               . sulfamethoxazole-trimethoprim (BACTRIM DS) 800-160 MG per tablet   Oral   Take 1 tablet by mouth 2 (two) times daily.   6 tablet   0    BP 175/110  Pulse 68  Temp(Src) 98.5 F (36.9 C) (Oral)  Resp 14  SpO2 100% Physical Exam  Nursing note and vitals reviewed. Constitutional: She is oriented to person, place, and time. She appears well-developed and well-nourished. No distress.  HENT:  Head: Normocephalic and atraumatic.  Eyes: EOM are normal.  Neck: Normal range of motion. Neck supple.  Pulmonary/Chest: Effort normal. No respiratory distress.  Musculoskeletal:  Normal foot architecture with the exception of mild pes planus. Marked tenderness to the plantar aspect of the heel and arch. No bony tenderness to the dorsal aspect of the foot, ankle or toes. No discoloration. Distal neurovascular motor sensory is intact pedal pulse 2+.  Lymphadenopathy:    She has no cervical adenopathy.  Neurological: She is alert and oriented to person, place, and time. No cranial  nerve deficit.  Skin: Skin is warm and dry.    ED Course  Procedures (including critical care time) Labs Review Labs Reviewed - No data to display Imaging Review No results found.   MDM   1. Plantar fasciitis of right foot    Ice as directed full length shoe inserts with high arch Read instructions for treatment. May need to see podiatrist as needed.    Hayden Rasmussen, NP 05/28/13 1151

## 2013-05-28 NOTE — Discharge Instructions (Signed)
Plantar Fasciitis Plantar fasciitis is a common condition that causes foot pain. It is soreness (inflammation) of the band of tough fibrous tissue on the bottom of the foot that runs from the heel bone (calcaneus) to the ball of the foot. The cause of this soreness may be from excessive standing, poor fitting shoes, running on hard surfaces, being overweight, having an abnormal walk, or overuse (this is common in runners) of the painful foot or feet. It is also common in aerobic exercise dancers and ballet dancers. SYMPTOMS  Most people with plantar fasciitis complain of:  Severe pain in the morning on the bottom of their foot especially when taking the first steps out of bed. This pain recedes after a few minutes of walking.  Severe pain is experienced also during walking following a long period of inactivity.  Pain is worse when walking barefoot or up stairs DIAGNOSIS   Your caregiver will diagnose this condition by examining and feeling your foot.  Special tests such as X-rays of your foot, are usually not needed. PREVENTION   Consult a sports medicine professional before beginning a new exercise program.  Walking programs offer a good workout. With walking there is a lower chance of overuse injuries common to runners. There is less impact and less jarring of the joints.  Begin all new exercise programs slowly. If problems or pain develop, decrease the amount of time or distance until you are at a comfortable level.  Wear good shoes and replace them regularly. Use full length inserts with high arch support.  Stretch your foot and the heel cords at the back of the ankle (Achilles tendon) both before and after exercise.  Run or exercise on even surfaces that are not hard. For example, asphalt is better than pavement.  Do not run barefoot on hard surfaces.  If using a treadmill, vary the incline.  Do not continue to workout if you have foot or joint problems. Seek professional help  if they do not improve. HOME CARE INSTRUCTIONS   Avoid activities that cause you pain until you recover.  Use ice or cold packs on the problem or painful areas after working out.  Only take over-the-counter or prescription medicines for pain, discomfort, or fever as directed by your caregiver.  Soft shoe inserts or athletic shoes with air or gel sole cushions may be helpful.  If problems continue or become more severe, consult a sports medicine caregiver or your own health care provider. Cortisone is a potent anti-inflammatory medication that may be injected into the painful area. You can discuss this treatment with your caregiver. MAKE SURE YOU:   Understand these instructions.  Will watch your condition.  Will get help right away if you are not doing well or get worse. Document Released: 11/24/2000 Document Revised: 05/24/2011 Document Reviewed: 01/24/2008 [facility] Patient Information 2014 [place], [place].

## 2013-05-28 NOTE — ED Notes (Signed)
38 yr old female is here today with complaints of left heel pain x 3 dys. She states she has not fallen on injured her foot but does stand and walk a lot on concrete flooring at her job.  Denies: SOB, Chest pain, fever

## 2013-05-28 NOTE — ED Provider Notes (Signed)
Medical screening examination/treatment/procedure(s) were performed by non-physician practitioner and as supervising physician I was immediately available for consultation/collaboration.  Deleon Passe, M.D.  Jim Lundin C Eleanor Dimichele, MD 05/28/13 2325 

## 2013-06-20 ENCOUNTER — Ambulatory Visit (INDEPENDENT_AMBULATORY_CARE_PROVIDER_SITE_OTHER): Payer: BC Managed Care – PPO | Admitting: Anesthesiology

## 2013-06-20 DIAGNOSIS — Z309 Encounter for contraceptive management, unspecified: Secondary | ICD-10-CM

## 2013-06-20 MED ORDER — MEDROXYPROGESTERONE ACETATE 150 MG/ML IM SUSP
150.0000 mg | Freq: Once | INTRAMUSCULAR | Status: AC
  Administered 2013-06-20: 150 mg via INTRAMUSCULAR

## 2013-09-17 ENCOUNTER — Ambulatory Visit (INDEPENDENT_AMBULATORY_CARE_PROVIDER_SITE_OTHER): Payer: BC Managed Care – PPO | Admitting: Anesthesiology

## 2013-09-17 DIAGNOSIS — Z3042 Encounter for surveillance of injectable contraceptive: Secondary | ICD-10-CM

## 2013-09-17 DIAGNOSIS — Z3049 Encounter for surveillance of other contraceptives: Secondary | ICD-10-CM

## 2013-09-17 MED ORDER — MEDROXYPROGESTERONE ACETATE 150 MG/ML IM SUSP
150.0000 mg | Freq: Once | INTRAMUSCULAR | Status: AC
  Administered 2013-09-17: 150 mg via INTRAMUSCULAR

## 2013-12-14 ENCOUNTER — Ambulatory Visit: Payer: BC Managed Care – PPO | Admitting: *Deleted

## 2013-12-14 DIAGNOSIS — Z3042 Encounter for surveillance of injectable contraceptive: Secondary | ICD-10-CM

## 2013-12-14 MED ORDER — MEDROXYPROGESTERONE ACETATE 150 MG/ML IM SUSP
150.0000 mg | Freq: Once | INTRAMUSCULAR | Status: AC
  Administered 2013-12-14: 150 mg via INTRAMUSCULAR

## 2014-01-02 ENCOUNTER — Encounter: Payer: BC Managed Care – PPO | Admitting: Women's Health

## 2014-01-14 ENCOUNTER — Encounter (HOSPITAL_COMMUNITY): Payer: Self-pay | Admitting: Emergency Medicine

## 2014-01-30 ENCOUNTER — Other Ambulatory Visit (HOSPITAL_COMMUNITY)
Admission: RE | Admit: 2014-01-30 | Discharge: 2014-01-30 | Disposition: A | Discharge status: Home / Self Care | Payer: BC Managed Care – PPO | Source: Ambulatory Visit | Attending: Gynecology | Admitting: Gynecology

## 2014-01-30 ENCOUNTER — Ambulatory Visit (INDEPENDENT_AMBULATORY_CARE_PROVIDER_SITE_OTHER): Payer: BC Managed Care – PPO | Admitting: Women's Health

## 2014-01-30 ENCOUNTER — Encounter: Payer: Self-pay | Admitting: Women's Health

## 2014-01-30 VITALS — BP 122/80 | Ht 61.0 in | Wt 149.0 lb

## 2014-01-30 DIAGNOSIS — Z01419 Encounter for gynecological examination (general) (routine) without abnormal findings: Secondary | ICD-10-CM | POA: Diagnosis not present

## 2014-01-30 DIAGNOSIS — Z13220 Encounter for screening for lipoid disorders: Secondary | ICD-10-CM

## 2014-01-30 DIAGNOSIS — Z833 Family history of diabetes mellitus: Secondary | ICD-10-CM

## 2014-01-30 DIAGNOSIS — Z30013 Encounter for initial prescription of injectable contraceptive: Secondary | ICD-10-CM

## 2014-01-30 LAB — CBC WITH DIFFERENTIAL/PLATELET
Basophils Absolute: 0.1 10*3/uL (ref 0.0–0.1)
Basophils Relative: 1 % (ref 0–1)
Eosinophils Absolute: 0.3 10*3/uL (ref 0.0–0.7)
Eosinophils Relative: 3 % (ref 0–5)
HCT: 37.4 % (ref 36.0–46.0)
Hemoglobin: 12.1 g/dL (ref 12.0–15.0)
Lymphocytes Relative: 28 % (ref 12–46)
Lymphs Abs: 2.7 10*3/uL (ref 0.7–4.0)
MCH: 28.2 pg (ref 26.0–34.0)
MCHC: 32.4 g/dL (ref 30.0–36.0)
MCV: 87.2 fL (ref 78.0–100.0)
MPV: 11.4 fL (ref 9.4–12.4)
Monocytes Absolute: 1 10*3/uL (ref 0.1–1.0)
Monocytes Relative: 10 % (ref 3–12)
Neutro Abs: 5.7 10*3/uL (ref 1.7–7.7)
Neutrophils Relative %: 58 % (ref 43–77)
Platelets: 375 10*3/uL (ref 150–400)
RBC: 4.29 MIL/uL (ref 3.87–5.11)
RDW: 13.1 % (ref 11.5–15.5)
WBC: 9.8 10*3/uL (ref 4.0–10.5)

## 2014-01-30 LAB — LIPID PANEL
Cholesterol: 197 mg/dL (ref 0–200)
HDL: 40 mg/dL (ref >39)
LDL Cholesterol: 146 mg/dL — ABNORMAL HIGH (ref 0–99)
Total CHOL/HDL Ratio: 4.9 Ratio
Triglycerides: 56 mg/dL (ref <150)
VLDL: 11 mg/dL (ref 0–40)

## 2014-01-30 LAB — GLUCOSE, RANDOM: Glucose, Bld: 73 mg/dL (ref 70–99)

## 2014-01-30 MED ORDER — MEDROXYPROGESTERONE ACETATE 150 MG/ML IM SUSP: INTRAMUSCULAR | Status: DC

## 2014-01-30 NOTE — Patient Instructions (Signed)

## 2014-01-30 NOTE — Progress Notes (Signed)
Meghan Davila 1976/01/22 [id number]    History:    Presents for annual exam.  Amenorrheic on Depo-Provera. Normal Pap history. 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 Anxiety/depression stable on Zoloft per primary care.  Past medical history, past surgical history, family history and social history were all reviewed and documented in the EPIC chart. School custodian. 2 children ages 2314 and for both doing well. History of GDM. Mother, sister hypertension.  ROS:  A  12 point ROS was performed and pertinent positives and negatives are included.  Exam:  Filed Vitals:   01/30/14 0821  BP: 122/80    General appearance:  Normal Thyroid:  Symmetrical, normal in size, without palpable masses or nodularity. Respiratory  Auscultation:  Clear without wheezing or rhonchi Cardiovascular  Auscultation:  Regular rate, without rubs, murmurs or gallops  Edema/varicosities:  Not grossly evident Abdominal  Soft,nontender, without masses, guarding or rebound.  Liver/spleen:  No organomegaly noted  Hernia:  None appreciated  Skin  Inspection:  Grossly normal   Breasts: Examined lying and sitting.     Right: Without masses, retractions, discharge or axillary adenopathy.     Left: Without masses, retractions, discharge or axillary adenopathy. Gentitourinary   Inguinal/mons:  Normal without inguinal adenopathy  External genitalia:  Normal  BUS/Urethra/Skene's glands:  Normal  Vagina:  Normal  Cervix:  Normal  Uterus:   normal in size, shape and contour.  Midline and mobile  Adnexa/parametria:     Rt: Without masses or tenderness.   Lt: Without masses or tenderness.  Anus and perineum: Normal  Digital rectal exam: Normal sphincter tone without palpated masses or tenderness  Assessment/Plan:  38 y.o. MBF G2P2 for annual exam with no complaints.  Amenorrheic on Depo-Provera Anxiety/depression stable on Zoloft  Plan: Depo-Provera 150 IM every 12 weeks, prescription, proper use given  and reviewed. SBE's, regular exercise, calcium rich diet, vitamin D 1000 daily encouraged. CBC, lipid panel, glucose, UA, Pap. Pap normal 2013, new screening guidelines reviewed.    Harrington ChallengerYOUNG,Jessey Stehlin J Woodbridge Center LLCWHNP, 9:24 AM 01/30/2014

## 2014-01-31 LAB — URINALYSIS W MICROSCOPIC + REFLEX CULTURE
Bacteria, UA: NONE SEEN
Bilirubin Urine: NEGATIVE
Casts: NONE SEEN
Crystals: NONE SEEN
Glucose, UA: NEGATIVE mg/dL
Ketones, ur: NEGATIVE mg/dL
Leukocytes, UA: NEGATIVE
Nitrite: NEGATIVE
Protein, ur: NEGATIVE mg/dL
Specific Gravity, Urine: 1.02 (ref 1.005–1.030)
Squamous Epithelial / LPF: NONE SEEN
Urobilinogen, UA: 1 mg/dL (ref 0.0–1.0)
pH: 6.5 (ref 5.0–8.0)

## 2014-01-31 LAB — CYTOLOGY - PAP

## 2014-03-14 ENCOUNTER — Ambulatory Visit (INDEPENDENT_AMBULATORY_CARE_PROVIDER_SITE_OTHER): Payer: BC Managed Care – PPO | Admitting: *Deleted

## 2014-03-14 DIAGNOSIS — Z3042 Encounter for surveillance of injectable contraceptive: Secondary | ICD-10-CM

## 2014-03-14 DIAGNOSIS — Z30013 Encounter for initial prescription of injectable contraceptive: Secondary | ICD-10-CM

## 2014-03-14 MED ORDER — MEDROXYPROGESTERONE ACETATE 150 MG/ML IM SUSP
150.0000 mg | Freq: Once | INTRAMUSCULAR | Status: AC
  Administered 2014-03-14: 150 mg via INTRAMUSCULAR

## 2014-05-07 ENCOUNTER — Encounter (HOSPITAL_COMMUNITY): Payer: Self-pay | Admitting: *Deleted

## 2014-05-07 ENCOUNTER — Emergency Department (HOSPITAL_COMMUNITY)
Admission: EM | Admit: 2014-05-07 | Discharge: 2014-05-07 | Disposition: A | Discharge status: Home / Self Care | Payer: BC Managed Care – PPO | Source: Home / Self Care | Attending: Family Medicine | Admitting: Family Medicine

## 2014-05-07 DIAGNOSIS — M7711 Lateral epicondylitis, right elbow: Secondary | ICD-10-CM

## 2014-05-07 MED ORDER — INDOMETHACIN 25 MG PO CAPS: 25.0000 mg | ORAL_CAPSULE | Freq: Three times a day (TID) | ORAL | Status: DC | PRN

## 2014-05-07 MED ORDER — IBUPROFEN 800 MG PO TABS
ORAL_TABLET | ORAL | Status: AC
  Filled 2014-05-07: qty 1

## 2014-05-07 MED ORDER — IBUPROFEN 800 MG PO TABS
800.0000 mg | ORAL_TABLET | Freq: Once | ORAL | Status: AC
  Administered 2014-05-07: 800 mg via ORAL

## 2014-05-07 NOTE — ED Provider Notes (Signed)
CSN: [id number]     Arrival date & time 05/07/14  0910 History   First MD Initiated Contact with Patient 05/07/14 1039     Chief Complaint  Patient presents with  . 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 Reports 3 days of pain and upper forearm and lateral elbow with ROM of right hand, wrist and elbow. Has tried taking a few doses of Excedrin with limited relief. No specific injury reported and no previous episodes or surgery. Reports herself to be otherwise healthy.   Patient is a 39 y.o. female presenting with arm pain. The history is provided by the patient.  Arm Pain This is a new problem. Episode onset: sx began 3 days ago. The problem occurs constantly. The problem has been gradually worsening.    Past Medical History  Diagnosis Date  . Gestational diabetes   . PIH (pregnancy induced hypertension)    Past Surgical History  Procedure Laterality Date  . Brain surgery  2001    shunt put in  . Breast reduction surgery  2008  . Cholecystectomy  2004  . Cesarean section  10/22/09  . Cesarean section  2002  . Breast surgery Bilateral 2008    reduction   Family History  Problem Relation Age of Onset  . Hypertension Mother   . Hypertension Sister   . Hypertension Maternal Grandmother   . Cancer Maternal Grandmother   . Hypertension Maternal Grandfather   . Diabetes Maternal Grandfather    History  Substance Use Topics  . Smoking status: Light Tobacco Smoker    Types: Cigarettes  . Smokeless tobacco: Never Used     Comment: smokes 2 cigs daily  . Alcohol Use: No   OB History    Gravida Para Term Preterm AB TAB SAB Ectopic Multiple Living   2 2        2      Review of Systems  All other systems reviewed and are negative.   Allergies  Review of patient's allergies indicates no known allergies.  Home Medications    Prior to Admission medications   Medication Sig Start Date End Date Taking? Authorizing Provider  indomethacin (INDOCIN) 25 MG capsule Take 1 capsule (25 mg total) by mouth 3 (three) times daily as needed for mild pain or moderate pain. 05/07/14   Ria ClockJennifer Lee H Lexine Jaspers, PA  medroxyPROGESTERone (DEPO-PROVERA) 150 MG/ML injection INJECT 1 ML INTO THE MUSCLE ONCE 01/30/14   Harrington ChallengerNancy J Young, NP  sertraline (ZOLOFT) 50 MG tablet Take 50 mg by mouth daily.  07/10/12   Historical Provider, MD   BP 118/94 mmHg  Pulse 72  Temp(Src) 98.6 F (37 C) (Oral)  Resp 16  SpO2 100% Physical Exam  Constitutional: She is oriented to person, place, and time. She appears well-developed and well-nourished.  HENT:  Head: Normocephalic and atraumatic.  Pulmonary/Chest: Effort normal.  Musculoskeletal:       Right elbow: She exhibits decreased range of motion. She exhibits no swelling, no effusion, no deformity and no laceration. Tenderness found. Lateral epicondyle tenderness noted.  ROM only limited by discomfort. CSM exam of RUE intact +pain at proximal lateral forearm with pronation/supination of right forearm and resisted wrist extension  Neurological: She is alert and oriented to person, place, and time.  Skin: Skin is warm and dry. No rash noted. No erythema.  Psychiatric: She has a normal mood and affect. Her behavior is  normal.  Nursing note and vitals reviewed.   ED Course  Procedures (including critical care time) Labs Review Labs Reviewed - No data to display  Imaging Review No results found.   MDM   1. Lateral epicondylitis (tennis elbow), right   Ice, indocin, rest followed by eccentric wrist extensor exercises. Follow up [facility] if no improvement.     Ria Clock, [place] 05/07/14 1144

## 2014-05-07 NOTE — Discharge Instructions (Signed)
Tennis Elbow °Your caregiver has diagnosed you with a condition often referred to as "tennis elbow." This results from small tears or soreness (inflammation) at the start (origin) of the extensor muscles of the forearm. Although the condition is often called tennis or golfer's elbow, it is caused by any repetitive action performed by your elbow. °HOME CARE INSTRUCTIONS °· If the condition has been short lived, rest may be the only treatment required. Using your opposite hand or arm to perform the task may help. Even changing your grip may help rest the extremity. These may even prevent the condition from recurring. °· Longer standing problems, however, will often be relieved faster by: °¨ Using anti-inflammatory agents. °¨ Applying ice packs for 30 minutes at the end of the working day, at bed time, or when activities are finished. °· Your caregiver may also have you wear a splint or sling. This will allow the inflamed tendon to heal. °At times, steroid injections aided with a local anesthetic will be required along with splinting for 1 to 2 weeks. Two to three steroid injections will often solve the problem. In some long standing cases, the inflamed tendon does not respond to conservative (non-surgical) therapy. Then surgery may be required to repair it. °MAKE SURE YOU:  °· Understand these instructions. °· Will watch your condition. °· Will get help right away if you are not doing well or get worse. °Document Released: 03/01/2005 Document Revised: 05/24/2011 Document Reviewed: 10/18/2007 °ExitCare® Patient Information ©2015 [employer]. This information is not intended to replace advice given to you by your health care provider. Make sure you discuss any questions you have with your health care provider. ° °Lateral Epicondylitis (Tennis Elbow) °with Rehab °Lateral epicondylitis involves inflammation and pain around the outer portion of the elbow. The pain is caused by inflammation of the tendons in the forearm  that bring back (extend) the wrist. Lateral epicondylitis is also called tennis elbow, because it is very common in tennis players. However, it may occur in any individual who extends the wrist repetitively. If lateral epicondylitis is left untreated, it may become a chronic problem. °SYMPTOMS  °· Pain, tenderness, and inflammation on the outer (lateral) side of the elbow. °· Pain or weakness with gripping activities. °· Pain that increases with wrist-twisting motions (playing tennis, using a screwdriver, opening a door or a jar). °· Pain with lifting objects, including a coffee cup. °CAUSES  °Lateral epicondylitis is caused by inflammation of the tendons that extend the wrist. Causes of injury may include: °· Repetitive stress and strain on the muscles and tendons that extend the wrist. °· Sudden change in activity level or intensity. °· Incorrect grip in racquet sports. °· Incorrect grip size of racquet (often too large). °· Incorrect hitting position or technique (usually backhand, leading with the elbow). °· Using a racket that is too heavy. °RISK INCREASES WITH: °· Sports or occupations that require repetitive and/or strenuous forearm and wrist movements (tennis, squash, racquetball, carpentry). °· Poor wrist and forearm strength and flexibility. °· Failure to warm up properly before activity. °· Resuming activity before healing, rehabilitation, and conditioning are complete. °PREVENTION  °· Warm up and stretch properly before activity. °· Maintain physical fitness: °¨ Strength, flexibility, and endurance. °¨ Cardiovascular fitness. °· Wear and use properly fitted equipment. °· Learn and use proper technique and have a coach correct improper technique. °· Wear a tennis elbow (counterforce) brace. °PROGNOSIS  °The course of this condition depends on the degree of the injury. If treated   properly, acute cases (symptoms lasting less than 4 weeks) are often resolved in 2 to 6 weeks. Chronic (longer lasting cases)  often resolve in 3 to 6 months but may require physical therapy. °RELATED COMPLICATIONS  °· Frequently recurring symptoms, resulting in a chronic problem. Properly treating the problem the first time decreases frequency of recurrence. °· Chronic inflammation, scarring tendon degeneration, and partial tendon tear, requiring surgery. °· Delayed healing or resolution of symptoms. °TREATMENT  °Treatment first involves the use of ice and medicine to reduce pain and inflammation. Strengthening and stretching exercises may help reduce discomfort if performed regularly. These exercises may be performed at home if the condition is an acute injury. Chronic cases may require a referral to a physical therapist for evaluation and treatment. Your caregiver may advise a corticosteroid injection to help reduce inflammation. Rarely, surgery is needed. °MEDICATION °· If pain medicine is needed, nonsteroidal anti-inflammatory medicines (aspirin and ibuprofen), or other minor pain relievers (acetaminophen), are often advised. °· Do not take pain medicine for 7 days before surgery. °· Prescription pain relievers may be given, if your caregiver thinks they are needed. Use only as directed and only as much as you need. °· Corticosteroid injections may be recommended. These injections should be reserved only for the most severe cases, because they can only be given a certain number of times. °HEAT AND COLD °· Cold treatment (icing) should be applied for 10 to 15 minutes every 2 to 3 hours for inflammation and pain, and immediately after activity that aggravates your symptoms. Use ice packs or an ice massage. °· Heat treatment may be used before performing stretching and strengthening activities prescribed by your caregiver, physical therapist, or athletic trainer. Use a heat pack or a warm water soak. °SEEK MEDICAL CARE IF: °Symptoms get worse or do not improve in 2 weeks, despite treatment. °EXERCISES  °RANGE OF MOTION (ROM) AND  STRETCHING EXERCISES - Epicondylitis, Lateral (Tennis Elbow) °These exercises may help you when beginning to rehabilitate your injury. Your symptoms may go away with or without further involvement from your physician, physical therapist, or athletic trainer. While completing these exercises, remember:  °· Restoring tissue flexibility helps normal motion to return to the joints. This allows healthier, less painful movement and activity. °· An effective stretch should be held for at least 30 seconds. °· A stretch should never be painful. You should only feel a gentle lengthening or release in the stretched tissue. °RANGE OF MOTION - Wrist Flexion, Active-Assisted °· Extend your right / left elbow with your fingers pointing down.* °· Gently pull the back of your hand towards you, until you feel a gentle stretch on the top of your forearm. °· Hold this position for __________ seconds. °Repeat __________ times. Complete this exercise __________ times per day.  °*If directed by your physician, physical therapist or athletic trainer, complete this stretch with your elbow bent, rather than extended. °RANGE OF MOTION - Wrist Extension, Active-Assisted °· Extend your right / left elbow and turn your palm upwards.* °· Gently pull your palm and fingertips back, so your wrist extends and your fingers point more toward the ground. °· You should feel a gentle stretch on the inside of your forearm. °· Hold this position for __________ seconds. °Repeat __________ times. Complete this exercise __________ times per day. °*If directed by your physician, physical therapist or athletic trainer, complete this stretch with your elbow bent, rather than extended. °STRETCH - Wrist Flexion °· Place the back of your right / left   hand on a tabletop, leaving your elbow slightly bent. Your fingers should point away from your body. °· Gently press the back of your hand down onto the table by straightening your elbow. You should feel a stretch on  the top of your forearm. °· Hold this position for __________ seconds. °Repeat __________ times. Complete this stretch __________ times per day.  °STRETCH - Wrist Extension  °· Place your right / left fingertips on a tabletop, leaving your elbow slightly bent. Your fingers should point backwards. °· Gently press your fingers and palm down onto the table by straightening your elbow. You should feel a stretch on the inside of your forearm. °· Hold this position for __________ seconds. °Repeat __________ times. Complete this stretch __________ times per day.  °STRENGTHENING EXERCISES - Epicondylitis, Lateral (Tennis Elbow) °These exercises may help you when beginning to rehabilitate your injury. They may resolve your symptoms with or without further involvement from your physician, physical therapist, or athletic trainer. While completing these exercises, remember:  °· Muscles can gain both the endurance and the strength needed for everyday activities through controlled exercises. °· Complete these exercises as instructed by your physician, physical therapist or athletic trainer. Increase the resistance and repetitions only as guided. °· You may experience muscle soreness or fatigue, but the pain or discomfort you are trying to eliminate should never worsen during these exercises. If this pain does get worse, stop and make sure you are following the directions exactly. If the pain is still present after adjustments, discontinue the exercise until you can discuss the trouble with your caregiver. °STRENGTH - Wrist Flexors °· Sit with your right / left forearm palm-up and fully supported on a table or countertop. Your elbow should be resting below the height of your shoulder. Allow your wrist to extend over the edge of the surface. °· Loosely holding a __________ weight, or a piece of rubber exercise band or tubing, slowly curl your hand up toward your forearm. °· Hold this position for __________ seconds. Slowly lower  the wrist back to the starting position in a controlled manner. °Repeat __________ times. Complete this exercise __________ times per day.  °STRENGTH - Wrist Extensors °· Sit with your right / left forearm palm-down and fully supported on a table or countertop. Your elbow should be resting below the height of your shoulder. Allow your wrist to extend over the edge of the surface. °· Loosely holding a __________ weight, or a piece of rubber exercise band or tubing, slowly curl your hand up toward your forearm. °· Hold this position for __________ seconds. Slowly lower the wrist back to the starting position in a controlled manner. °Repeat __________ times. Complete this exercise __________ times per day.  °STRENGTH - Ulnar Deviators °· Stand with a ____________________ weight in your right / left hand, or sit while holding a rubber exercise band or tubing, with your healthy arm supported on a table or countertop. °· Move your wrist, so that your pinkie travels toward your forearm and your thumb moves away from your forearm. °· Hold this position for __________ seconds and then slowly lower the wrist back to the starting position. °Repeat __________ times. Complete this exercise __________ times per day °STRENGTH - Radial Deviators °· Stand with a ____________________ weight in your right / left hand, or sit while holding a rubber exercise band or tubing, with your injured arm supported on a table or countertop. °· Raise your hand upward in front of you or pull up   on the rubber tubing. °· Hold this position for __________ seconds and then slowly lower the wrist back to the starting position. °Repeat __________ times. Complete this exercise __________ times per day. °STRENGTH - Forearm Supinators  °· Sit with your right / left forearm supported on a table, keeping your elbow below shoulder height. Rest your hand over the edge, palm down. °· Gently grip a hammer or a soup ladle. °· Without moving your elbow, slowly turn  your palm and hand upward to a "thumbs-up" position. °· Hold this position for __________ seconds. Slowly return to the starting position. °Repeat __________ times. Complete this exercise __________ times per day.  °STRENGTH - Forearm Pronators  °· Sit with your right / left forearm supported on a table, keeping your elbow below shoulder height. Rest your hand over the edge, palm up. °· Gently grip a hammer or a soup ladle. °· Without moving your elbow, slowly turn your palm and hand upward to a "thumbs-up" position. °· Hold this position for __________ seconds. Slowly return to the starting position. °Repeat __________ times. Complete this exercise __________ times per day.  °STRENGTH - Grip °· Grasp a tennis ball, a dense sponge, or a large, rolled sock in your hand. °· Squeeze as hard as you can, without increasing any pain. °· Hold this position for __________ seconds. Release your grip slowly. °Repeat __________ times. Complete this exercise __________ times per day.  °STRENGTH - Elbow Extensors, Isometric °· Stand or sit upright, on a firm surface. Place your right / left arm so that your palm faces your stomach, and it is at the height of your waist. °· Place your opposite hand on the underside of your forearm. Gently push up as your right / left arm resists. Push as hard as you can with both arms, without causing any pain or movement at your right / left elbow. Hold this stationary position for __________ seconds. °Gradually release the tension in both arms. Allow your muscles to relax completely before repeating. °Document Released: 03/01/2005 Document Revised: 07/16/2013 Document Reviewed: 06/13/2008 °[employer]® Patient Information ©2015 [employer]. This information is not intended to replace advice given to you by your health care provider. Make sure you discuss any questions you have with your health care provider. ° °

## 2014-05-07 NOTE — ED Notes (Signed)
Pt  Has   Pain    r  Arm  And  Hand x  4  Days   - she denys  Any  specefic  Injury        Pt  States   She uses her r  Hand  Quite  A  Bit  At  Work    She     Is  Wearing  A  Brace

## 2014-06-03 ENCOUNTER — Other Ambulatory Visit (INDEPENDENT_AMBULATORY_CARE_PROVIDER_SITE_OTHER): Payer: BC Managed Care – PPO

## 2014-06-03 DIAGNOSIS — Z3042 Encounter for surveillance of injectable contraceptive: Secondary | ICD-10-CM

## 2014-06-03 MED ORDER — MEDROXYPROGESTERONE ACETATE 150 MG/ML IM SUSP
150.0000 mg | Freq: Once | INTRAMUSCULAR | Status: AC
  Administered 2014-06-03: 150 mg via INTRAMUSCULAR

## 2015-05-19 ENCOUNTER — Encounter (HOSPITAL_COMMUNITY): Payer: Self-pay | Admitting: Emergency Medicine

## 2015-05-19 ENCOUNTER — Emergency Department (HOSPITAL_COMMUNITY)
Admission: EM | Admit: 2015-05-19 | Discharge: 2015-05-19 | Disposition: A | Discharge status: Home / Self Care | Payer: BC Managed Care – PPO | Source: Home / Self Care | Attending: Family Medicine | Admitting: Family Medicine

## 2015-05-19 ENCOUNTER — Other Ambulatory Visit (HOSPITAL_COMMUNITY)
Admission: RE | Admit: 2015-05-19 | Discharge: 2015-05-19 | Disposition: A | Discharge status: Home / Self Care | Payer: BC Managed Care – PPO | Source: Ambulatory Visit | Attending: Family Medicine | Admitting: Family Medicine

## 2015-05-19 DIAGNOSIS — Z029 Encounter for administrative examinations, unspecified: Secondary | ICD-10-CM | POA: Diagnosis not present

## 2015-05-19 DIAGNOSIS — R0982 Postnasal drip: Secondary | ICD-10-CM

## 2015-05-19 DIAGNOSIS — J029 Acute pharyngitis, unspecified: Secondary | ICD-10-CM

## 2015-05-19 DIAGNOSIS — T700XXA Otitic barotrauma, initial encounter: Secondary | ICD-10-CM | POA: Diagnosis not present

## 2015-05-19 DIAGNOSIS — H6983 Other specified disorders of Eustachian tube, bilateral: Secondary | ICD-10-CM

## 2015-05-19 LAB — POCT RAPID STREP A: Streptococcus, Group A Screen (Direct): NEGATIVE

## 2015-05-19 NOTE — ED Provider Notes (Signed)
CSN: [id number]     Arrival date & time 05/19/15  1444 History   First MD Initiated Contact with Patient 05/19/15 1733     Chief Complaint  Patient presents with  . Sore Throat   (Consider location/radiation/quality/duration/timing/severity/associated sxs/prior Treatment) HPI Comments: 40 year old female complaining of earaches and sore throat for 5 days. Denies fever, chills, chest pain, shortness of breath GI or GU symptoms. She has tried Motrin with partial relief.   Past Medical History  Diagnosis Date  . Gestational diabetes   . PIH (pregnancy induced hypertension)    Past Surgical History  Procedure Laterality Date  . Brain surgery  2001    shunt put in  . Breast reduction surgery  2008  . Cholecystectomy  2004  . Cesarean section  10/22/09  . Cesarean section  2002  . Breast surgery Bilateral 2008    reduction   Family History  Problem Relation Age of Onset  . Hypertension Mother   . Hypertension Sister   . Hypertension Maternal Grandmother   . Cancer Maternal Grandmother   . Hypertension Maternal Grandfather   . Diabetes Maternal Grandfather    Social History  Substance Use Topics  . Smoking status: Light Tobacco Smoker    Types: Cigarettes  . Smokeless tobacco: Never Used     Comment: smokes 2 cigs daily  . Alcohol Use: No   OB History    Gravida Para Term Preterm AB TAB SAB Ectopic Multiple Living   2 2        2      Review of Systems  Constitutional: Negative for fever, chills, activity change, appetite change and fatigue.  HENT: Positive for ear pain and sore throat. Negative for congestion, facial swelling, postnasal drip and rhinorrhea.   Eyes: Negative.   Respiratory: Negative.  Negative for cough and shortness of breath.   Cardiovascular: Negative.   Gastrointestinal: Negative.   Musculoskeletal: Negative for neck pain and neck stiffness.  Skin: Negative for pallor and rash.  Neurological: Negative.     Allergies  Review of patient's  allergies indicates no known allergies.  Home Medications   Prior to Admission medications   Medication Sig Start Date End Date Taking? Authorizing Provider  indomethacin (INDOCIN) 25 MG capsule Take 1 capsule (25 mg total) by mouth 3 (three) times daily as needed for mild pain or moderate pain. 05/07/14   Ria ClockJennifer Lee H Presson, PA  medroxyPROGESTERone (DEPO-PROVERA) 150 MG/ML injection INJECT 1 ML INTO THE MUSCLE ONCE 01/30/14   Harrington ChallengerNancy J Young, NP  sertraline (ZOLOFT) 50 MG tablet Take 50 mg by mouth daily.  07/10/12   Historical Provider, MD   Meds Ordered and Administered this Visit  Medications - No data to display  BP 175/93 mmHg  Pulse 76  Temp(Src) 98.4 F (36.9 C) (Oral)  Resp 16  SpO2 100% No data found.   Physical Exam  Constitutional: She is oriented to person, place, and time. She appears well-developed and well-nourished. No distress.  HENT:  Head: Normocephalic.  Right Ear: External ear normal.  Left Ear: External ear normal.  Mouth/Throat: No oropharyngeal exudate.  Bilateral TMs with retraction. Minor erythema to the left. Oropharynx with light erythema and clear PND.  Eyes: Conjunctivae and EOM are normal.  Neck: Normal range of motion. Neck supple.  Cardiovascular: Normal rate and regular rhythm.   Pulmonary/Chest: Effort normal and breath sounds normal. No respiratory distress. She has no wheezes. She has no rales.  Musculoskeletal: Normal range of motion.  Lymphadenopathy:    She has no cervical adenopathy.  Neurological: She is alert and oriented to person, place, and time. No cranial nerve deficit.  Skin: Skin is warm and dry.  Psychiatric: She has a normal mood and affect.  Nursing note and vitals reviewed.   ED Course  Procedures (including critical care time)  Labs Review Labs Reviewed  POCT RAPID STREP A    Imaging Review No results found.   Visual Acuity Review  Right Eye Distance:   Left Eye Distance:   Bilateral Distance:     Right Eye Near:   Left Eye Near:    Bilateral Near:         MDM   1. Sore throat   2. PND (post-nasal drip)   3. ETD (eustachian tube dysfunction), bilateral   4. Barotitis media, initial encounter    For nasal and head congestion may take Sudafed PE 10 mg every 4 hours as needed. Saline nasal spray used frequently. For drainage may use Allegra, Claritin or Zyrtec. If you need stronger medicine to stop drainage may take Chlor-Trimeton 2-4 mg every 4 hours. This may cause drowsiness. Ibuprofen 600 mg every 6 hours as needed for pain, discomfort or fever. Drink plenty of fluids and stay well-hydrated.     Hayden Rasmussen, NP 05/19/15 1902

## 2015-05-19 NOTE — ED Notes (Signed)
Patient reports throat pain with swallowing, this pain makes left ear painful.  Onset 3/3

## 2015-05-19 NOTE — Discharge Instructions (Signed)
Barotitis Media Barotitis media is inflammation of your middle ear. This occurs when the auditory tube (eustachian tube) leading from the back of your nose (nasopharynx) to your eardrum is blocked. This blockage may result from a cold, environmental allergies, or an upper respiratory infection. Unresolved barotitis media may lead to damage or hearing loss (barotrauma), which may become permanent. HOME CARE INSTRUCTIONS   Use medicines as recommended by your health care provider. Over-the-counter medicines will help unblock the canal and can help during times of air travel.  Do not put anything into your ears to clean or unplug them. Eardrops will not be helpful.  Do not swim, dive, or fly until your health care provider says it is all right to do so. If these activities are necessary, chewing gum with frequent, forceful swallowing may help. It is also helpful to hold your nose and gently blow to pop your ears for equalizing pressure changes. This forces air into the eustachian tube.  Only take over-the-counter or prescription medicines for pain, discomfort, or fever as directed by your health care provider.  A decongestant may be helpful in decongesting the middle ear and make pressure equalization easier. SEEK MEDICAL CARE IF:  You experience a serious form of dizziness in which you feel as if the room is spinning and you feel nauseated (vertigo).  Your symptoms only involve one ear. SEEK IMMEDIATE MEDICAL CARE IF:   You develop a severe headache, dizziness, or severe ear pain.  You have bloody or pus-like drainage from your ears.  You develop a fever.  Your problems do not improve or become worse. MAKE SURE YOU:   Understand these instructions.  Will watch your condition.  Will get help right away if you are not doing well or get worse.   This information is not intended to replace advice given to you by your health care provider. Make sure you discuss any questions you have with  your health care provider.   Document Released: 02/27/2000 Document Revised: 12/20/2012 Document Reviewed: 09/26/2012 Elsevier Interactive Patient Education 2016 [employer]  Sore Throat A sore throat is pain, burning, irritation, or scratchiness of the throat. There is often pain or tenderness when swallowing or talking. A sore throat may be accompanied by other symptoms, such as coughing, sneezing, fever, and swollen neck glands. A sore throat is often the first sign of another sickness, such as a cold, flu, strep throat, or mononucleosis (commonly known as mono). Most sore throats go away without medical treatment. CAUSES  The most common causes of a sore throat include:  A viral infection, such as a cold, flu, or mono.  A bacterial infection, such as strep throat, tonsillitis, or whooping cough.  Seasonal allergies.  Dryness in the air.  Irritants, such as smoke or pollution.  Gastroesophageal reflux disease (GERD). HOME CARE INSTRUCTIONS   Only take over-the-counter medicines as directed by your caregiver.  Drink enough fluids to keep your urine clear or pale yellow.  Rest as needed.  Try using throat sprays, lozenges, or sucking on hard candy to ease any pain (if older than 4 years or as directed).  Sip warm liquids, such as broth, herbal tea, or warm water with honey to relieve pain temporarily. You may also eat or drink cold or frozen liquids such as frozen ice pops.  Gargle with salt water (mix 1 tsp salt with 8 oz of water).  Do not smoke and avoid secondhand smoke.  Put a cool-mist humidifier in your bedroom at  night to moisten the air. You can also turn on a hot shower and sit in the bathroom with the door closed for 5-10 minutes. SEEK IMMEDIATE MEDICAL CARE IF:  You have difficulty breathing.  You are unable to swallow fluids, soft foods, or your saliva.  You have increased swelling in the throat.  Your sore throat does not get better in 7 days.  You  have nausea and vomiting.  You have a fever or persistent symptoms for more than 2-3 days.  You have a fever and your symptoms suddenly get worse. MAKE SURE YOU:   Understand these instructions.  Will watch your condition.  Will get help right away if you are not doing well or get worse.   This information is not intended to replace advice given to you by your health care provider. Make sure you discuss any questions you have with your health care provider.   Document Released: 04/08/2004 Document Revised: 03/22/2014 Document Reviewed: 11/07/2011 Elsevier Interactive Patient Education 2016 Else[employer] Inc.  Upper Respiratory Infection, Adult For nasal and head congestion may take Sudafed PE 10 mg every 4 hours as needed. Saline nasal spray used frequently. For drainage may use Allegra, Claritin or Zyrtec. If you need stronger medicine to stop drainage may take Chlor-Trimeton 2-4 mg every 4 hours. This may cause drowsiness. Ibuprofen 600 mg every 6 hours as needed for pain, discomfort or fever. Drink plenty of fluids and stay well-hydrated.  Most upper respiratory infections (URIs) are a viral infection of the air passages leading to the lungs. A URI affects the nose, throat, and upper air passages. The most common type of URI is nasopharyngitis and is typically referred to as "the common cold." URIs run their course and usually go away on their own. Most of the time, a URI does not require medical attention, but sometimes a bacterial infection in the upper airways can follow a viral infection. This is called a secondary infection. Sinus and middle ear infections are common types of secondary upper respiratory infections. Bacterial pneumonia can also complicate a URI. A URI can worsen asthma and chronic obstructive pulmonary disease (COPD). Sometimes, these complications can require emergency medical care and may be life threatening.  CAUSES Almost all URIs are caused by viruses. A virus is  a type of germ and can spread from one person to another.  RISKS FACTORS You may be at risk for a URI if:   You smoke.   You have chronic heart or lung disease.  You have a weakened defense (immune) system.   You are very young or very old.   You have nasal allergies or asthma.  You work in crowded or poorly ventilated areas.  You work in health care facilities or schools. SIGNS AND SYMPTOMS  Symptoms typically develop 2-3 days after you come in contact with a cold virus. Most viral URIs last 7-10 days. However, viral URIs from the influenza virus (flu virus) can last 14-18 days and are typically more severe. Symptoms may include:   Runny or stuffy (congested) nose.   Sneezing.   Cough.   Sore throat.   Headache.   Fatigue.   Fever.   Loss of appetite.   Pain in your forehead, behind your eyes, and over your cheekbones (sinus pain).  Muscle aches.  DIAGNOSIS  Your health care provider may diagnose a URI by:  Physical exam.  Tests to check that your symptoms are not due to another condition such as:  Strep throat.  Sinusitis.  Pneumonia.  Asthma. TREATMENT  A URI goes away on its own with time. It cannot be cured with medicines, but medicines may be prescribed or recommended to relieve symptoms. Medicines may help:  Reduce your fever.  Reduce your cough.  Relieve nasal congestion. HOME CARE INSTRUCTIONS   Take medicines only as directed by your health care provider.   Gargle warm saltwater or take cough drops to comfort your throat as directed by your health care provider.  Use a warm mist humidifier or inhale steam from a shower to increase air moisture. This may make it easier to breathe.  Drink enough fluid to keep your urine clear or pale yellow.   Eat soups and other clear broths and maintain good nutrition.   Rest as needed.   Return to work when your temperature has returned to normal or as your health care provider  advises. You may need to stay home longer to avoid infecting others. You can also use a face mask and careful hand washing to prevent spread of the virus.  Increase the usage of your inhaler if you have asthma.   Do not use any tobacco products, including cigarettes, chewing tobacco, or electronic cigarettes. If you need help quitting, ask your health care provider. PREVENTION  The best way to protect yourself from getting a cold is to practice good hygiene.   Avoid oral or hand contact with people with cold symptoms.   Wash your hands often if contact occurs.  There is no clear evidence that vitamin C, vitamin E, echinacea, or exercise reduces the chance of developing a cold. However, it is always recommended to get plenty of rest, exercise, and practice good nutrition.  SEEK MEDICAL CARE IF:   You are getting worse rather than better.   Your symptoms are not controlled by medicine.   You have chills.  You have worsening shortness of breath.  You have brown or red mucus.  You have yellow or brown nasal discharge.  You have pain in your face, especially when you bend forward.  You have a fever.  You have swollen neck glands.  You have pain while swallowing.  You have white areas in the back of your throat. SEEK IMMEDIATE MEDICAL CARE IF:   You have severe or persistent:  Headache.  Ear pain.  Sinus pain.  Chest pain.  You have chronic lung disease and any of the following:  Wheezing.  Prolonged cough.  Coughing up blood.  A change in your usual mucus.  You have a stiff neck.  You have changes in your:  Vision.  Hearing.  Thinking.  Mood. MAKE SURE YOU:   Understand these instructions.  Will watch your condition.  Will get help right away if you are not doing well or get worse.   This information is not intended to replace advice given to you by your health care provider. Make sure you discuss any questions you have with your health care  provider.   Document Released: 08/25/2000 Document Revised: 07/16/2014 Document Reviewed: 06/06/2013 Elsevier Interactive Patient Education [employer]

## 2015-05-21 LAB — CULTURE, GROUP A STREP (THRC)

## 2015-05-22 ENCOUNTER — Telehealth: Payer: Self-pay | Admitting: Internal Medicine

## 2015-05-22 DIAGNOSIS — J020 Streptococcal pharyngitis: Secondary | ICD-10-CM

## 2015-05-22 MED ORDER — PENICILLIN V POTASSIUM 500 MG PO TABS: 500.0000 mg | ORAL_TABLET | Freq: Two times a day (BID) | ORAL | Status: DC

## 2015-05-22 NOTE — ED Notes (Signed)
Please let patient know that throat cx is growing a few group A strep.   Rx for penicillin V sent to pharmacy of record [facility] on [address]). Recheck for persistent symptoms.  LM   Eustace MooreLaura W Seibert Keeter, MD 05/22/15 [id number]

## 2015-05-23 ENCOUNTER — Telehealth (HOSPITAL_COMMUNITY): Payer: Self-pay | Admitting: Emergency Medicine

## 2015-05-23 NOTE — ED Notes (Signed)
Trevette, patient access, reported patient in lobby questioning where medicine was sent to.  reviewed chart and informed Trevette, patient access, this nurse would call in script to pharmacy.

## 2015-05-23 NOTE — ED Notes (Signed)
Called pt and notified of recent lab results from visit 3/6 Pt ID'd properly... Reports feeling better and sx have subsided  Per Dr. Dayton ScrapeMurray,  Please let patient know that throat cx is growing a few group A strep.  Rx for penicillin V sent to pharmacy of record [facility] on [address]). Recheck for persistent symptoms. LM  Adv pt if sx are not getting better to return  Pt verb understanding

## 2015-08-06 ENCOUNTER — Ambulatory Visit: Payer: BC Managed Care – PPO | Admitting: Gynecology

## 2015-08-06 DIAGNOSIS — Z0289 Encounter for other administrative examinations: Secondary | ICD-10-CM

## 2015-09-17 ENCOUNTER — Emergency Department (HOSPITAL_COMMUNITY): Payer: BC Managed Care – PPO

## 2015-09-17 ENCOUNTER — Encounter (HOSPITAL_COMMUNITY): Payer: Self-pay

## 2015-09-17 ENCOUNTER — Emergency Department (HOSPITAL_COMMUNITY)
Admission: EM | Admit: 2015-09-17 | Discharge: 2015-09-17 | Disposition: A | Discharge status: Home / Self Care | Payer: BC Managed Care – PPO | Attending: Emergency Medicine | Admitting: Emergency Medicine

## 2015-09-17 DIAGNOSIS — R51 Headache: Secondary | ICD-10-CM | POA: Insufficient documentation

## 2015-09-17 DIAGNOSIS — Z7982 Long term (current) use of aspirin: Secondary | ICD-10-CM | POA: Diagnosis not present

## 2015-09-17 DIAGNOSIS — F17210 Nicotine dependence, cigarettes, uncomplicated: Secondary | ICD-10-CM | POA: Diagnosis not present

## 2015-09-17 DIAGNOSIS — R519 Headache, unspecified: Secondary | ICD-10-CM

## 2015-09-17 DIAGNOSIS — Z982 Presence of cerebrospinal fluid drainage device: Secondary | ICD-10-CM

## 2015-09-17 DIAGNOSIS — Z79899 Other long term (current) drug therapy: Secondary | ICD-10-CM | POA: Insufficient documentation

## 2015-09-17 DIAGNOSIS — M542 Cervicalgia: Secondary | ICD-10-CM | POA: Diagnosis present

## 2015-09-17 MED ORDER — KETOROLAC TROMETHAMINE 60 MG/2ML IM SOLN
60.0000 mg | Freq: Once | INTRAMUSCULAR | Status: AC
  Administered 2015-09-17: 60 mg via INTRAMUSCULAR
  Filled 2015-09-17: qty 2

## 2015-09-17 MED ORDER — METHOCARBAMOL 500 MG PO TABS: 500.0000 mg | ORAL_TABLET | Freq: Three times a day (TID) | ORAL | Status: DC | PRN

## 2015-09-17 MED ORDER — METHOCARBAMOL 500 MG PO TABS
500.0000 mg | ORAL_TABLET | Freq: Once | ORAL | Status: AC
  Administered 2015-09-17: 500 mg via ORAL
  Filled 2015-09-17: qty 1

## 2015-09-17 MED ORDER — IBUPROFEN 800 MG PO TABS: 800.0000 mg | ORAL_TABLET | Freq: Three times a day (TID) | ORAL | Status: DC

## 2015-09-17 NOTE — ED Provider Notes (Signed)
CSN: [id number]     Arrival date & time 09/17/15  0847 History   First MD Initiated Contact with Patient 09/17/15 [phone]     Chief Complaint  Patient presents with  . neck pain      (Consider location/radiation/quality/duration/timing/severity/associated sxs/prior Treatment) HPI Comments: 40 yo F w/ shunt in place, no other medical problems presents with 5 days of neck pain. Intermittent. Worse in AM, progressively improves with ibuprofen, movement and time. Returns after nap or sleeping. No fever, nausea, vomiting, sore throat, difficulty swallowing. Does note that she has a swelling around thed shunt in the back of her neck. Says the pain 'feels like a crick' in my neck.    Past Medical History  Diagnosis Date  . Gestational diabetes   . PIH (pregnancy induced hypertension)    Past Surgical History  Procedure Laterality Date  . Brain surgery  2001    shunt put in  . Breast reduction surgery  2008  . Cholecystectomy  2004  . Cesarean section  10/22/09  . Cesarean section  2002  . Breast surgery Bilateral 2008    reduction   Family History  Problem Relation Age of Onset  . Hypertension Mother   . Hypertension Sister   . Hypertension Maternal Grandmother   . Cancer Maternal Grandmother   . Hypertension Maternal Grandfather   . Diabetes Maternal Grandfather    Social History  Substance Use Topics  . Smoking status: Light Tobacco Smoker    Types: Cigarettes  . Smokeless tobacco: Never Used     Comment: smokes 2 cigs daily  . Alcohol Use: No   OB History    Gravida Para Term Preterm AB TAB SAB Ectopic Multiple Living   2 2        2      Review of Systems  All other systems reviewed and are negative.     Allergies  Review of patient's allergies indicates no known allergies.  Home Medications   Prior to Admission medications   Medication Sig Start Date End Date Taking? Authorizing Provider  aspirin-acetaminophen-caffeine (EXCEDRIN MIGRAINE) [phone] MG tablet  Take 2 tablets by mouth every 6 (six) hours as needed for headache.   Yes Historical Provider, MD  etonogestrel (NEXPLANON) 68 MG IMPL implant 1 each by Subdermal route once. August 21, 2015   Yes Historical Provider, MD  ibuprofen (ADVIL,MOTRIN) 800 MG tablet Take 1 tablet (800 mg total) by mouth 3 (three) times daily. 09/17/15   Marily MemosJason Virgle Arth, MD  methocarbamol (ROBAXIN) 500 MG tablet Take 1 tablet (500 mg total) by mouth every 8 (eight) hours as needed for muscle spasms. 09/17/15   Marily MemosJason Andrw Mcguirt, MD  sertraline (ZOLOFT) 50 MG tablet Take 50 mg by mouth daily. Reported on 09/17/2015 07/10/12   Historical Provider, MD   BP 145/86 mmHg  Pulse 68  Temp(Src) 98.5 F (36.9 C) (Oral)  Resp 16  Ht 5\' 1"  (1.549 m)  Wt 130 lb (58.968 kg)  BMI 24.58 kg/m2  SpO2 100% Physical Exam  Constitutional: She is oriented to person, place, and time. She appears well-developed and well-nourished.  HENT:  Head: Normocephalic and atraumatic.  Right Ear: Tympanic membrane, external ear and ear canal normal.  Left Ear: Tympanic membrane, external ear and ear canal normal.  Mouth/Throat: Normal dentition. No dental abscesses or uvula swelling. No oropharyngeal exudate, posterior oropharyngeal edema, posterior oropharyngeal erythema or tonsillar abscesses.  Eyes: Conjunctivae are normal. Pupils are equal, round, and reactive to light.  Neck: Normal range  of motion.  Right posterior single lymphadenopathy  Cardiovascular: Normal rate and regular rhythm.   Pulmonary/Chest: No stridor. No respiratory distress.  Abdominal: She exhibits no distension.  Neurological: She is alert and oriented to person, place, and time.  No altered mental status, able to give full seemingly accurate history.  Face is symmetric, EOM's intact, pupils equal and reactive, vision intact, tongue and uvula midline without deviation Upper and Lower extremity motor 5/5, intact pain perception in distal extremities, 2+ reflexes in biceps, patella and  achilles tendons. Finger to nose normal, heel to shin normal.   Nursing note and vitals reviewed.   ED Course  Procedures (including critical care time) Labs Review Labs Reviewed - No data to display  Imaging Review Dg Skull 1-3 Views  09/17/2015  CLINICAL DATA:  Headache and stiff neck. EXAM: SKULL - 1-3 VIEW COMPARISON:  Scout image from 10/14/2007 head CT FINDINGS: Ventriculoperitoneal shunt with ventriculostomy tip over the expected location of the right lateral ventricle. Catheter tubing and connectors have an unchanged radiographic appearance. No incidental osseous finding in the skull or face. IMPRESSION: VP shunt with stable catheter appearance compared to 2009. Electronically Signed   By: Marnee Spring M.D.   On: 09/17/2015 10:39   Dg Neck Soft Tissue  09/17/2015  CLINICAL DATA:  Headache and stiff neck.  Shunt series. EXAM: NECK SOFT TISSUES - 1+ VIEW COMPARISON:  09/17/2015 skull images FINDINGS: Right sided VP catheter. The catheter extends into the lower chest and not completely visualized. Visualized lungs are clear. Normal appearance of the visualized heart and mediastinum. Trachea is midline. IMPRESSION: Visualized portions of the right VP shunt in the neck and chest are intact. Electronically Signed   By: Richarda Overlie M.D.   On: 09/17/2015 10:35   Dg Chest 1 View  09/17/2015  CLINICAL DATA:  Headache and stiff neck.  History of shunt EXAM: CHEST 1 VIEW COMPARISON:  06/02/2014 FINDINGS: VP shunt tubing over the right chest is continuous. Normal heart size and mediastinal contours. No acute infiltrate or edema. No effusion or pneumothorax. No acute osseous findings. IMPRESSION: 1. VP shunt with continuous catheter over the right chest. 2. No evidence of active cardiopulmonary disease. Electronically Signed   By: Marnee Spring M.D.   On: 09/17/2015 10:35   Dg Abd 1 View  09/17/2015  CLINICAL DATA:  Headache and stiff neck.  History of shunt EXAM: ABDOMEN - 1 VIEW COMPARISON:  None.  FINDINGS: VP shunt catheter traverses the right abdomen with tip in the left lower quadrant. No concerning mass-effect around the catheter tip. Normal bowel gas pattern. Cholecystectomy clips. IMPRESSION: VP shunt with continuous tubing over the abdomen. Electronically Signed   By: Marnee Spring M.D.   On: 09/17/2015 10:36   Ct Head Wo Contrast  09/17/2015  CLINICAL DATA:  Pressure on right side of head near shunt. Headache for 5 days. EXAM: CT HEAD WITHOUT CONTRAST TECHNIQUE: Contiguous axial images were obtained from the base of the skull through the vertex without intravenous contrast. COMPARISON:  None. FINDINGS: Brain: There is a right-sided ventriculostomy catheter which enters from a right frontal approach. The tip terminates within the posterior aspect of the right lateral ventricle near the corpus callosum. When compared with the examination from 8/1/9 the ventricular volumes appear slightly increased. No evidence of acute infarction, hemorrhage, extra-axial collection, or mass effect. Vascular: No hyperdense vessel or unexpected calcification. Skull: Negative for fracture or focal lesion. Sinuses/Orbits: No acute findings. Other: None. IMPRESSION: 1. Slight increase  in volume of the ventricles compared with 10/14/2007. Examination is otherwise within normal limits. Electronically Signed   By: Signa Kellaylor  Stroud M.D.   On: 09/17/2015 10:48   I have personally reviewed and evaluated these images and lab results as part of my medical decision-making.   EKG Interpretation None      MDM   Final diagnoses:  Headache   Slightly enlarged ventricles but no other evidence of shunt dysfunction. Patient without any significant symptoms for hydrocephalus so will follow with neurosurgery if she has progressive symptoms. Otherwise will treat for torticollis and neck spasm. Low suspicion for infectious causes for her neck pain such as PTA, RPA, meningitis.  New Prescriptions: New Prescriptions    IBUPROFEN (ADVIL,MOTRIN) 800 MG TABLET    Take 1 tablet (800 mg total) by mouth 3 (three) times daily.   METHOCARBAMOL (ROBAXIN) 500 MG TABLET    Take 1 tablet (500 mg total) by mouth every 8 (eight) hours as needed for muscle spasms.     I have personally and contemperaneously reviewed labs and imaging and used in my decision making as above.   A medical screening exam was performed and I feel the patient has had an appropriate workup for their chief complaint at this time and likelihood of emergent condition existing is low and thus workup can continue on an outpatient basis.. Their vital signs are stable. They have been counseled on decision, discharge, follow up and which symptoms necessitate immediate return to the emergency department.  They verbally stated understanding and agreement with plan and discharged in stable condition.      Marily MemosJason Shereese Bonnie, MD 09/17/15 1110

## 2015-09-17 NOTE — ED Notes (Signed)
Patient transported to X-ray 

## 2015-09-17 NOTE — ED Notes (Addendum)
Patient complains of posterior neck stiffness x 5 days, reports that she awoke with the pain in the morning last Friday, pain with any turn of her head, no headache. Concerned that she has VP shunt and this related, no headache, no nausea, no trauma. Did get some relief with ibuprofen

## 2016-07-28 ENCOUNTER — Encounter: Payer: Self-pay | Admitting: Gynecology

## 2018-01-11 ENCOUNTER — Other Ambulatory Visit: Payer: Self-pay

## 2018-01-11 ENCOUNTER — Emergency Department (HOSPITAL_COMMUNITY): Payer: BC Managed Care – PPO

## 2018-01-11 ENCOUNTER — Encounter (HOSPITAL_COMMUNITY): Payer: Self-pay

## 2018-01-11 ENCOUNTER — Observation Stay (HOSPITAL_COMMUNITY)
Admission: EM | Admit: 2018-01-11 | Discharge: 2018-01-13 | Disposition: A | Discharge status: Home / Self Care | Payer: BC Managed Care – PPO | Attending: Internal Medicine | Admitting: Internal Medicine

## 2018-01-11 DIAGNOSIS — Z79899 Other long term (current) drug therapy: Secondary | ICD-10-CM | POA: Diagnosis not present

## 2018-01-11 DIAGNOSIS — F17210 Nicotine dependence, cigarettes, uncomplicated: Secondary | ICD-10-CM | POA: Insufficient documentation

## 2018-01-11 DIAGNOSIS — I169 Hypertensive crisis, unspecified: Secondary | ICD-10-CM | POA: Diagnosis present

## 2018-01-11 DIAGNOSIS — J323 Chronic sphenoidal sinusitis: Secondary | ICD-10-CM | POA: Diagnosis present

## 2018-01-11 DIAGNOSIS — R51 Headache: Secondary | ICD-10-CM | POA: Diagnosis not present

## 2018-01-11 DIAGNOSIS — Z982 Presence of cerebrospinal fluid drainage device: Secondary | ICD-10-CM | POA: Diagnosis not present

## 2018-01-11 DIAGNOSIS — R05 Cough: Secondary | ICD-10-CM

## 2018-01-11 DIAGNOSIS — D72829 Elevated white blood cell count, unspecified: Secondary | ICD-10-CM | POA: Diagnosis present

## 2018-01-11 DIAGNOSIS — Z7982 Long term (current) use of aspirin: Secondary | ICD-10-CM | POA: Insufficient documentation

## 2018-01-11 DIAGNOSIS — R519 Headache, unspecified: Secondary | ICD-10-CM | POA: Diagnosis present

## 2018-01-11 DIAGNOSIS — I10 Essential (primary) hypertension: Secondary | ICD-10-CM | POA: Diagnosis not present

## 2018-01-11 DIAGNOSIS — R059 Cough, unspecified: Secondary | ICD-10-CM

## 2018-01-11 DIAGNOSIS — I160 Hypertensive urgency: Principal | ICD-10-CM | POA: Diagnosis present

## 2018-01-11 DIAGNOSIS — A419 Sepsis, unspecified organism: Secondary | ICD-10-CM | POA: Diagnosis present

## 2018-01-11 LAB — CBC WITH DIFFERENTIAL/PLATELET
Abs Immature Granulocytes: 0.21 10*3/uL — ABNORMAL HIGH (ref 0.00–0.07)
Basophils Absolute: 0.1 10*3/uL (ref 0.0–0.1)
Basophils Relative: 0 %
Eosinophils Absolute: 0 10*3/uL (ref 0.0–0.5)
Eosinophils Relative: 0 %
HCT: 36.3 % (ref 36.0–46.0)
Hemoglobin: 11.4 g/dL — ABNORMAL LOW (ref 12.0–15.0)
Immature Granulocytes: 1 %
Lymphocytes Relative: 2 %
Lymphs Abs: 0.7 10*3/uL (ref 0.7–4.0)
MCH: 28.1 pg (ref 26.0–34.0)
MCHC: 31.4 g/dL (ref 30.0–36.0)
MCV: 89.6 fL (ref 80.0–100.0)
Monocytes Absolute: 0.8 10*3/uL (ref 0.1–1.0)
Monocytes Relative: 3 %
Neutro Abs: 28.3 10*3/uL — ABNORMAL HIGH (ref 1.7–7.7)
Neutrophils Relative %: 94 %
Platelets: 333 10*3/uL (ref 150–400)
RBC: 4.05 MIL/uL (ref 3.87–5.11)
RDW: 13 % (ref 11.5–15.5)
WBC: 30.1 10*3/uL — ABNORMAL HIGH (ref 4.0–10.5)
nRBC: 0 % (ref 0.0–0.2)

## 2018-01-11 LAB — COMPREHENSIVE METABOLIC PANEL
ALT: 21 U/L (ref 0–44)
AST: 17 U/L (ref 15–41)
Albumin: 4 g/dL (ref 3.5–5.0)
Alkaline Phosphatase: 53 U/L (ref 38–126)
Anion gap: 8 (ref 5–15)
BUN: 11 mg/dL (ref 6–20)
CO2: 22 mmol/L (ref 22–32)
Calcium: 8.5 mg/dL — ABNORMAL LOW (ref 8.9–10.3)
Chloride: 106 mmol/L (ref 98–111)
Creatinine, Ser: 0.65 mg/dL (ref 0.44–1.00)
GFR calc Af Amer: >60 mL/min (ref >60)
GFR calc non Af Amer: >60 mL/min (ref >60)
Glucose, Bld: 121 mg/dL — ABNORMAL HIGH (ref 70–99)
Potassium: 3.5 mmol/L (ref 3.5–5.1)
Sodium: 136 mmol/L (ref 135–145)
Total Bilirubin: 0.8 mg/dL (ref 0.3–1.2)
Total Protein: 7.1 g/dL (ref 6.5–8.1)

## 2018-01-11 LAB — BRAIN NATRIURETIC PEPTIDE: B Natriuretic Peptide: 68.9 pg/mL (ref 0.0–100.0)

## 2018-01-11 LAB — TROPONIN I: Troponin I: <0.03 ng/mL (ref <0.03)

## 2018-01-11 LAB — MRSA PCR SCREENING: MRSA by PCR: NEGATIVE

## 2018-01-11 MED ORDER — ENOXAPARIN SODIUM 40 MG/0.4ML ~~LOC~~ SOLN
40.0000 mg | SUBCUTANEOUS | Status: DC
  Administered 2018-01-11 – 2018-01-12 (×2): 40 mg via SUBCUTANEOUS
  Filled 2018-01-11 (×3): qty 0.4

## 2018-01-11 MED ORDER — SODIUM CHLORIDE 0.9 % IV SOLN: INTRAVENOUS | Status: DC | PRN

## 2018-01-11 MED ORDER — AMOXICILLIN-POT CLAVULANATE 875-125 MG PO TABS
1.0000 | ORAL_TABLET | Freq: Two times a day (BID) | ORAL | Status: DC
  Administered 2018-01-11 – 2018-01-13 (×5): 1 via ORAL
  Filled 2018-01-11 (×5): qty 1

## 2018-01-11 MED ORDER — ONDANSETRON HCL 4 MG/2ML IJ SOLN
4.0000 mg | Freq: Three times a day (TID) | INTRAMUSCULAR | Status: DC | PRN
  Administered 2018-01-12 – 2018-01-13 (×3): 4 mg via INTRAVENOUS
  Filled 2018-01-11 (×3): qty 2

## 2018-01-11 MED ORDER — AMLODIPINE BESYLATE 10 MG PO TABS
10.0000 mg | ORAL_TABLET | Freq: Every day | ORAL | Status: DC
  Administered 2018-01-11 – 2018-01-13 (×3): 10 mg via ORAL
  Filled 2018-01-11 (×2): qty 1
  Filled 2018-01-11: qty 2

## 2018-01-11 MED ORDER — ACETAMINOPHEN 325 MG PO TABS: 650.0000 mg | ORAL_TABLET | Freq: Four times a day (QID) | ORAL | Status: DC | PRN

## 2018-01-11 MED ORDER — HYDROCHLOROTHIAZIDE 25 MG PO TABS
25.0000 mg | ORAL_TABLET | Freq: Every day | ORAL | Status: DC
Start: 2018-01-11 — End: 2018-01-13
  Administered 2018-01-11 – 2018-01-13 (×3): 25 mg via ORAL
  Filled 2018-01-11 (×3): qty 1

## 2018-01-11 MED ORDER — AMLODIPINE BESYLATE 5 MG PO TABS
10.0000 mg | ORAL_TABLET | Freq: Every day | ORAL | Status: DC
  Filled 2018-01-11: qty 2

## 2018-01-11 MED ORDER — SODIUM CHLORIDE 0.9 % IV BOLUS
500.0000 mL | Freq: Once | INTRAVENOUS | Status: AC
  Administered 2018-01-11: 500 mL via INTRAVENOUS

## 2018-01-11 MED ORDER — KETOROLAC TROMETHAMINE 30 MG/ML IJ SOLN
30.0000 mg | Freq: Once | INTRAMUSCULAR | Status: AC
  Administered 2018-01-11: 30 mg via INTRAVENOUS
  Filled 2018-01-11: qty 1

## 2018-01-11 MED ORDER — MAGNESIUM SULFATE 2 GM/50ML IV SOLN
2.0000 g | Freq: Once | INTRAVENOUS | Status: AC
  Administered 2018-01-11: 2 g via INTRAVENOUS
  Filled 2018-01-11: qty 50

## 2018-01-11 MED ORDER — METOCLOPRAMIDE HCL 5 MG/ML IJ SOLN
10.0000 mg | Freq: Once | INTRAMUSCULAR | Status: AC
  Administered 2018-01-11: 10 mg via INTRAVENOUS
  Filled 2018-01-11: qty 2

## 2018-01-11 MED ORDER — ONDANSETRON 4 MG PO TBDP: 4.0000 mg | ORAL_TABLET | Freq: Three times a day (TID) | ORAL | Status: DC | PRN

## 2018-01-11 MED ORDER — LABETALOL HCL 5 MG/ML IV SOLN
5.0000 mg | Freq: Once | INTRAVENOUS | Status: AC
  Administered 2018-01-11: 5 mg via INTRAVENOUS
  Filled 2018-01-11: qty 4

## 2018-01-11 MED ORDER — LABETALOL HCL 5 MG/ML IV SOLN
10.0000 mg | Freq: Four times a day (QID) | INTRAVENOUS | Status: DC | PRN
  Administered 2018-01-11: 10 mg via INTRAVENOUS
  Filled 2018-01-11 (×2): qty 4

## 2018-01-11 MED ORDER — HYDROCODONE-ACETAMINOPHEN 5-325 MG PO TABS
1.0000 | ORAL_TABLET | Freq: Four times a day (QID) | ORAL | Status: DC | PRN
  Administered 2018-01-11 – 2018-01-12 (×3): 2 via ORAL
  Administered 2018-01-12: 1 via ORAL
  Administered 2018-01-13: 2 via ORAL
  Filled 2018-01-11: qty 1
  Filled 2018-01-11 (×4): qty 2

## 2018-01-11 MED ORDER — HYDRALAZINE HCL 20 MG/ML IJ SOLN
5.0000 mg | Freq: Once | INTRAMUSCULAR | Status: AC
  Administered 2018-01-11: 5 mg via INTRAVENOUS
  Filled 2018-01-11: qty 1

## 2018-01-11 MED ORDER — IBUPROFEN 200 MG PO TABS
600.0000 mg | ORAL_TABLET | Freq: Four times a day (QID) | ORAL | Status: DC | PRN
  Administered 2018-01-12 – 2018-01-13 (×4): 600 mg via ORAL
  Filled 2018-01-11 (×4): qty 3

## 2018-01-11 MED ORDER — LABETALOL HCL 5 MG/ML IV SOLN
10.0000 mg | Freq: Once | INTRAVENOUS | Status: AC
  Administered 2018-01-11: 10 mg via INTRAVENOUS
  Filled 2018-01-11: qty 4

## 2018-01-11 MED ORDER — ACETAMINOPHEN 650 MG RE SUPP: 650.0000 mg | Freq: Four times a day (QID) | RECTAL | Status: DC | PRN

## 2018-01-11 MED ORDER — IBUPROFEN 200 MG PO TABS: 600.0000 mg | ORAL_TABLET | Freq: Four times a day (QID) | ORAL | Status: DC | PRN

## 2018-01-11 MED ORDER — LABETALOL HCL 5 MG/ML IV SOLN
0.5000 mg/min | INTRAVENOUS | Status: DC
  Administered 2018-01-11 – 2018-01-12 (×2): 0.5 mg/min via INTRAVENOUS
  Filled 2018-01-11: qty 40
  Filled 2018-01-11: qty 80

## 2018-01-11 NOTE — H&P (Addendum)
History and Physical    Meghan Davila HQI:[id number] DOB: Mar 28, 1975 DOA: 01/11/2018  PCP: Patient, No Pcp Per Patient coming from: Home  Chief Complaint: Headache  HPI: Meghan Davila is a 42 y.o. female with medical history significant of hydrocephalus status post VP shunt, gestational diabetes, high blood pressure.  Patient presented secondary to 2 days of worsening intermittent frontal headache.  Patient states that the headache is throbbing headache with no radiation.  She has tried ibuprofen 400 mg which was initially helping.  She took some today which did not help her headache.  No sick contacts.  She has not received a flu shot.  No nasal congestion, sneezing, rhinorrhea, postnasal drip.  ED Course: Vitals: Afebrile, normal pulse, normal respirations, BP ~ 180/90-100, on room air Labs: WBC of 30.1k, ANC of 28.3k, non-fasting glucose of 121 Imaging: None Medications/Course: Toradol, labetalol x2, Reglan, hydralazine  Review of Systems: Review of Systems  Constitutional: Negative for chills and fever.  HENT: Negative for congestion and sinus pain.   Respiratory: Positive for cough (chronic). Negative for sputum production, shortness of breath and wheezing.   Cardiovascular: Negative for chest pain.  Gastrointestinal: Negative for abdominal pain, constipation, diarrhea, nausea and vomiting.  Genitourinary: Negative for dysuria, frequency and urgency.  Musculoskeletal: Negative for neck pain.  Neurological: Positive for headaches. Negative for loss of consciousness.  All other systems reviewed and are negative.   Past Medical History:  Diagnosis Date  . Gestational diabetes   . PIH (pregnancy induced hypertension)     Past Surgical History:  Procedure Laterality Date  . BRAIN SURGERY  2001   shunt put in  . BREAST REDUCTION SURGERY  2008  . BREAST SURGERY Bilateral 2008   reduction  . CESAREAN SECTION  10/22/09  . CESAREAN SECTION  2002  . CHOLECYSTECTOMY  2004      reports that she has been smoking cigarettes. She has never used smokeless tobacco. She reports that she does not drink alcohol or use drugs.  No Known Allergies  Family History  Problem Relation Age of Onset  . Hypertension Mother   . Hypertension Sister   . Hypertension Maternal Grandmother   . Cancer Maternal Grandmother   . Hypertension Maternal Grandfather   . Diabetes Maternal Grandfather     Prior to Admission medications   Medication Sig Start Date End Date Taking? Authorizing Provider  aspirin-acetaminophen-caffeine (EXCEDRIN MIGRAINE) [phone] MG tablet Take 2 tablets by mouth every 6 (six) hours as needed for headache.   Yes [provider]  medroxyPROGESTERone (DEPO-PROVERA) 150 MG/ML injection INJECT 1 ML INTO THE MUSCLE ONCE Patient not taking: Reported on 09/17/2015 01/30/14 09/17/15  Harrington Challenger, NP    Physical Exam:  Physical Exam  Constitutional: She is oriented to person, place, and time. She appears well-developed and well-nourished. No distress.  HENT:  Mouth/Throat: Oropharynx is clear and moist.  Eyes: Pupils are equal, round, and reactive to light. Conjunctivae and EOM are normal.  Neck: Normal range of motion. No neck rigidity.  Cardiovascular: Normal rate, regular rhythm and normal heart sounds.  No murmur heard. Pulmonary/Chest: Effort normal and breath sounds normal. No respiratory distress. She has no wheezes. She has no rales.  Abdominal: Soft. Bowel sounds are normal. She exhibits no distension. There is no tenderness. There is no rebound and no guarding.  Musculoskeletal: Normal range of motion. She exhibits no edema or tenderness.  Lymphadenopathy:    She has no cervical adenopathy.  Neurological: She is alert  and oriented to person, place, and time.  Skin: Skin is warm and dry. She is not diaphoretic.     Labs on Admission: I have personally reviewed following labs and imaging studies  CBC: Recent Labs  Lab 01/11/18 0955   WBC 30.1*  NEUTROABS 28.3*  HGB 11.4*  HCT 36.3  MCV 89.6  PLT 333    Basic Metabolic Panel: Recent Labs  Lab 01/11/18 0955  NA 136  K 3.5  CL 106  CO2 22  GLUCOSE 121*  BUN 11  CREATININE 0.65  CALCIUM 8.5*    GFR: CrCl cannot be calculated (Unknown ideal weight.).  Liver Function Tests: Recent Labs  Lab 01/11/18 0955  AST 17  ALT 21  ALKPHOS 53  BILITOT 0.8  PROT 7.1  ALBUMIN 4.0   Cardiac Enzymes: Recent Labs  Lab 01/11/18 0955  TROPONINI <0.03    Urine analysis:    Component Value Date/Time   COLORURINE YELLOW 01/30/2014 0832   APPEARANCEUR CLEAR 01/30/2014 0832   LABSPEC 1.020 01/30/2014 0832   PHURINE 6.5 01/30/2014 0832   GLUCOSEU NEG 01/30/2014 0832   HGBUR TRACE (A) 01/30/2014 0832   BILIRUBINUR NEG 01/30/2014 0832   KETONESUR NEG 01/30/2014 0832   PROTEINUR NEG 01/30/2014 0832   UROBILINOGEN 1 01/30/2014 0832   NITRITE NEG 01/30/2014 0832   LEUKOCYTESUR NEG 01/30/2014 0832     Radiological Exams on Admission: Ct Head Wo Contrast  Result Date: 01/11/2018 CLINICAL DATA:  Right-sided headache.  Nausea. EXAM: CT HEAD WITHOUT CONTRAST TECHNIQUE: Contiguous axial images were obtained from the base of the skull through the vertex without intravenous contrast. COMPARISON:  September 17, 2015 FINDINGS: Brain: There is a shunt catheter present from a right frontal approach. The tip of the catheter is slightly posterior to the atrium of the right lateral ventricle with the tip abutting and likely extending into the splenium of the corpus callosum on the right, unchanged. The ventricles appear prominent but stable. The sulci appear normal. There is mild cerebellar tonsillar ectopia. There is mild invagination of CSF into the sella. There is no intracranial mass, hemorrhage, extra-axial fluid collection, or midline shift. No focal brain parenchymal lesions are evident. No evident acute infarct. Vascular: No hyperdense vessel. No appreciable vascular  calcification evident. Skull: There is a defect in the right frontal bone at the site of shunt catheter placement. Bony calvarium otherwise appears intact. Sinuses/Orbits: There is patchy opacity in the right sphenoid sinus. Other visualized paranasal sinuses are clear. Visualized orbits appear symmetric bilaterally. Other: Mastoid air cells are clear. IMPRESSION: Ventriculoperitoneal shunt catheter on the right, unchanged in position. Stable prominence of the ventricles with sulci appearing normal. No progression of hydrocephalus since 2017. Cerebellar tonsillar ectopia is stable. Mild invagination of CSF into the sella noted. No brain parenchymal lesions. No acute infarct. No mass or hemorrhage. There is right sphenoid sinusitis. Electronically Signed   By: Bretta Bang III M.D.   On: 01/11/2018 07:21    EKG: Independently reviewed. NSR  Assessment/Plan Principal Problem:   Hypertensive urgency Active Problems:   Headache   Sphenoid sinusitis   Hypertensive urgency Patient with a history of hypertension that previously required treatment. She is unsure of what she was prescribed prior. Minimal improvement with Labetalol. Hydralazine given as well. -Start hydrochlorothiazide 25 mg daily -Labetalol 10 mg prn SBP/DBP > 180/100 -Vital signs q4 hours  Headache Unsure of etiology. Ventricles stable on CT imaging, suggesting properly functioning shunt. Symptoms improved slightly with Toradol earlier this morning.  Also received Reglan. -Tylenol/Ibuprofen/Norco prn -Treat elevated blood pressure and sinusitis  Sphenoid sinusitis Incidental finding. Possibly could be contributing to headache. Associated elevated WBC. Unsure of acuity.  -Start Augmentin BID -Outpatient follow-up for resolution  History of hydrocephalus S/p VP shunt Stable on CT imaging.  Leukocytosis Possibly secondary to sinusitis. Patient with no infectious symptoms. Neutrophilia. No associated fever. Low concern for  meningitis. -Chest x-ray -Treat above problem -Repeat CBC in AM   DVT prophylaxis: Lovenox Code Status: Full code Family Communication: None at bedside Disposition Plan: Medical floor; anticipate discharge home tomorrow Consults called: None Admission status: Observation   Jacquelin Hawking, MD [facility] 01/11/2018, 11:53 AM  If 7PM-7AM, please contact night-coverage [email or web address] Password [email or web address]

## 2018-01-11 NOTE — Progress Notes (Signed)
MD paged for tachycardia and hypertension. Too early to give prn antihypertensive currently.

## 2018-01-11 NOTE — Progress Notes (Signed)
Pt to be transferred to telemetry floor per Dr. Caleb Popp. Pt informed of transfer.

## 2018-01-11 NOTE — ED Notes (Signed)
Hydrochlorothiazide not given due to not being in ED Pxyis. Notification sent to main pharmacy.

## 2018-01-11 NOTE — ED Triage Notes (Signed)
Pt complains of a severe headache for three days, she states she has a shunt on the right side and her head hurts in that area Pt denies vomiting but is nauseated

## 2018-01-11 NOTE — ED Notes (Signed)
Patient transported to X-ray 

## 2018-01-11 NOTE — ED Provider Notes (Signed)
[facility]-EMERGENCY DEPT Provider Note   CSN: [id number] Arrival date & time: 01/11/18  4098     History   Chief Complaint No chief complaint on file.   HPI Meghan Davila is a 42 y.o. female.  Patient with remote hx VP shunt for hydrocephalus Meghan Davila) presents c/o dull to throbbing frontal headache for the past 3 days. Symptoms gradual onset, constant, persistent. No eye pain or change in vision. No neck pain or stiffness. Denies any recent head trauma, syncope or fall. +nausea. No vomiting. Denies associated numbness or weakness, no change in speech or vision, no loss of normal functional ability. Mild sinus congestion. No purulent drainage. No fever.   The history is provided by the patient.    Past Medical History:  Diagnosis Date  . Gestational diabetes   . PIH (pregnancy induced hypertension)     Patient Active Problem List   Diagnosis Date Noted  . Umbilical pain 07/18/2012  . Hydrocephalus (HCC) 09/13/2011  . Gestational diabetes mellitus in pregnancy 09/13/2011    Past Surgical History:  Procedure Laterality Date  . BRAIN SURGERY  2001   shunt put in  . BREAST REDUCTION SURGERY  2008  . BREAST SURGERY Bilateral 2008   reduction  . CESAREAN SECTION  10/22/09  . CESAREAN SECTION  2002  . CHOLECYSTECTOMY  2004     OB History    Gravida  2   Para  2   Term      Preterm      AB      Living  2     SAB      TAB      Ectopic      Multiple      Live Births               Home Medications    Prior to Admission medications   Medication Sig Start Date End Date Taking? Authorizing Provider  aspirin-acetaminophen-caffeine (EXCEDRIN MIGRAINE) [phone] MG tablet Take 2 tablets by mouth every 6 (six) hours as needed for headache.    [provider]  etonogestrel (NEXPLANON) 68 MG IMPL implant 1 each by Subdermal route once. August 21, 2015    [provider]  ibuprofen (ADVIL,MOTRIN) 800 MG tablet Take  1 tablet (800 mg total) by mouth 3 (three) times daily. 09/17/15   Mesner, Barbara Cower, MD  methocarbamol (ROBAXIN) 500 MG tablet Take 1 tablet (500 mg total) by mouth every 8 (eight) hours as needed for muscle spasms. 09/17/15   Mesner, Barbara Cower, MD  sertraline (ZOLOFT) 50 MG tablet Take 50 mg by mouth daily. Reported on 09/17/2015 07/10/12   [provider]  medroxyPROGESTERone (DEPO-PROVERA) 150 MG/ML injection INJECT 1 ML INTO THE MUSCLE ONCE Patient not taking: Reported on 09/17/2015 01/30/14 09/17/15  Harrington Challenger, NP    Family History Family History  Problem Relation Age of Onset  . Hypertension Mother   . Hypertension Sister   . Hypertension Maternal Grandmother   . Cancer Maternal Grandmother   . Hypertension Maternal Grandfather   . Diabetes Maternal Grandfather     Social History Social History   Tobacco Use  . Smoking status: Light Tobacco Smoker    Types: Cigarettes  . Smokeless tobacco: Never Used  . Tobacco comment: smokes 2 cigs daily  Substance Use Topics  . Alcohol use: No  . Drug use: No     Allergies   Patient has no known allergies.   Review of Systems  Review of Systems  Constitutional: Negative for fever.  HENT: Positive for congestion. Negative for sore throat.   Eyes: Negative for pain, redness and visual disturbance.  Respiratory: Negative for cough and shortness of breath.   Cardiovascular: Negative for chest pain.  Gastrointestinal: Positive for nausea. Negative for abdominal pain and vomiting.  Genitourinary: Negative for flank pain.  Musculoskeletal: Negative for back pain and neck pain.  Skin: Negative for rash.  Neurological: Positive for headaches. Negative for dizziness, speech difficulty, weakness and numbness.  Hematological: Does not bruise/bleed easily.  Psychiatric/Behavioral: Negative for confusion.     Physical Exam Updated Vital Signs BP (!) 188/111 (BP Location: Right Arm)   Pulse 75   Temp 98.3 F (36.8 C) (Oral)   Resp 18    SpO2 99%   Physical Exam  Constitutional: She is oriented to person, place, and time. She appears well-developed and well-nourished. No distress.  HENT:  Head: Atraumatic.  Nose: Nose normal.  Mouth/Throat: Oropharynx is clear and moist.  No sinus or temporal tenderness.  Eyes: Pupils are equal, round, and reactive to light. Conjunctivae and EOM are normal. No scleral icterus.  Fundi w sharp discs, no p.e.  Neck: Neck supple. No tracheal deviation present. No thyromegaly present.  No stiffness or rigidity.   Cardiovascular: Normal rate, regular rhythm, normal heart sounds and intact distal pulses. Exam reveals no gallop and no friction rub.  No murmur heard. Pulmonary/Chest: Effort normal and breath sounds normal. No respiratory distress.  Abdominal: Soft. Normal appearance and bowel sounds are normal. She exhibits no distension. There is no tenderness.  Genitourinary:  Genitourinary Comments: No cva tenderness.  Musculoskeletal: Normal range of motion. She exhibits no edema or tenderness.  Neurological: She is alert and oriented to person, place, and time. No cranial nerve deficit.  Speech is clear/fluent. Pt alert/oriented. No pronator drift. Motor intact bil, stre 5/5. sens grossly intact. Steady gait.   Skin: Skin is warm and dry. No rash noted. She is not diaphoretic.  Psychiatric: She has a normal mood and affect.  Nursing note and vitals reviewed.    ED Treatments / Results  Labs (all labs ordered are listed, but only abnormal results are displayed) Labs Reviewed - No data to display  EKG None  Radiology No results found.  Procedures Procedures (including critical care time)  Medications Ordered in ED Medications  metoCLOPramide (REGLAN) injection 10 mg (has no administration in time range)  sodium chloride 0.9 % bolus 500 mL (has no administration in time range)     Initial Impression / Assessment and Plan / ED Course  I have reviewed the triage vital signs  and the nursing notes.  Pertinent labs & imaging results that were available during my care of the patient were reviewed by me and considered in my medical decision making (see chart for details).  Pt indicates took ibuprofen this AM, no other meds for pain.   Iv ns bolus. reglan iv. CT.  Reviewed nursing notes and prior charts for additional history. Of note, most recent advanced imaging of head for headaches, studies neg for acute process.   CT and response to initial meds is pending - signed out to AM EDP to check ct when resulted - if acute abn/hydrocephalus, consult to NS.       Final Clinical Impressions(s) / ED Diagnoses   Final diagnoses:  None    ED Discharge Orders    None       Cathren Laine, MD 01/11/18 [phone]

## 2018-01-11 NOTE — ED Notes (Signed)
ED TO INPATIENT HANDOFF REPORT  Name/Age/Gender Meghan Davila 42 y.o. female  Code Status    Code Status Orders  (From admission, onward)         Start     Ordered   01/11/18 1542  Full code  Continuous     01/11/18 1541        Code Status History    This patient has a current code status but no historical code status.      Home/SNF/Other Home  Chief Complaint headache  Level of Care/Admitting Diagnosis ED Disposition    ED Disposition Condition Comment   Admit  Hospital Area: [facility] [[id number]]  Level of Care: Med-Surg [16]  Diagnosis: Hypertensive urgency [235573]  Admitting Physician: Mariel Aloe [[id number]]  Attending Physician: Mariel Aloe [phone]  PT Class (Do Not Modify): Observation [104]  PT Acc Code (Do Not Modify): Observation [10022]       Medical History Past Medical History:  Diagnosis Date  . Gestational diabetes   . PIH (pregnancy induced hypertension)     Allergies No Known Allergies  IV Location/Drains/Wounds Patient Lines/Drains/Airways Status   Active Line/Drains/Airways    Name:   Placement date:   Placement time:   Site:   Days:   Peripheral IV 01/11/18 Left Antecubital   01/11/18    0646    Antecubital   less than 1          Labs/Imaging Results for orders placed or performed during the hospital encounter of 01/11/18 (from the past 48 hour(s))  CBC with Differential     Status: Abnormal   Collection Time: 01/11/18  9:55 AM  Result Value Ref Range   WBC 30.1 (H) 4.0 - 10.5 K/uL   RBC 4.05 3.87 - 5.11 MIL/uL   Hemoglobin 11.4 (L) 12.0 - 15.0 g/dL   HCT 36.3 36.0 - 46.0 %   MCV 89.6 80.0 - 100.0 fL   MCH 28.1 26.0 - 34.0 pg   MCHC 31.4 30.0 - 36.0 g/dL   RDW 13.0 11.5 - 15.5 %   Platelets 333 150 - 400 K/uL   nRBC 0.0 0.0 - 0.2 %   Neutrophils Relative % 94 %   Neutro Abs 28.3 (H) 1.7 - 7.7 K/uL   Lymphocytes Relative 2 %   Lymphs Abs 0.7 0.7 - 4.0 K/uL   Monocytes Relative 3 %    Monocytes Absolute 0.8 0.1 - 1.0 K/uL   Eosinophils Relative 0 %   Eosinophils Absolute 0.0 0.0 - 0.5 K/uL   Basophils Relative 0 %   Basophils Absolute 0.1 0.0 - 0.1 K/uL   RBC Morphology MORPHOLOGY UNREMARKABLE    Immature Granulocytes 1 %   Abs Immature Granulocytes 0.21 (H) 0.00 - 0.07 K/uL    Comment: Performed at [facility], [place] [address], [place],  [address]  Comprehensive metabolic panel     Status: Abnormal   Collection Time: 01/11/18  9:55 AM  Result Value Ref Range   Sodium 136 135 - 145 mmol/L   Potassium 3.5 3.5 - 5.1 mmol/L   Chloride 106 98 - 111 mmol/L   CO2 22 22 - 32 mmol/L   Glucose, Bld 121 (H) 70 - 99 mg/dL   BUN 11 6 - 20 mg/dL   Creatinine, Ser 0.65 0.44 - 1.00 mg/dL   Calcium 8.5 (L) 8.9 - 10.3 mg/dL   Total Protein 7.1 6.5 - 8.1 g/dL   Albumin 4.0 3.5 - 5.0  g/dL   AST 17 15 - 41 U/L   ALT 21 0 - 44 U/L   Alkaline Phosphatase 53 38 - 126 U/L   Total Bilirubin 0.8 0.3 - 1.2 mg/dL   GFR calc non Af Amer >60 >60 mL/min   GFR calc Af Amer >60 >60 mL/min    Comment: (NOTE) The eGFR has been calculated using the CKD EPI equation. This calculation has not been validated in all clinical situations. eGFR's persistently <60 mL/min signify possible Chronic Kidney Disease.    Anion gap 8 5 - 15    Comment: Performed at [facility], [place] [address], [place], [place] [address]  Troponin I     Status: None   Collection Time: 01/11/18  9:55 AM  Result Value Ref Range   Troponin I <0.03 <0.03 ng/mL    Comment: Performed at [facility], [place] [address], [place], [place] [address]  Brain natriuretic peptide     Status: None   Collection Time: 01/11/18  9:55 AM  Result Value Ref Range   B Natriuretic Peptide 68.9 0.0 - 100.0 pg/mL    Comment: Performed at [facility], [place] [address], [place],  [address]   Dg Chest 2 View  Result Date: 01/11/2018 CLINICAL DATA:  Severe  headaches EXAM: CHEST - 2 VIEW COMPARISON:  09/17/2015 FINDINGS: Cardiac shadows within normal limits. Right-sided shunt catheter is noted and intact. The lungs are clear. No bony abnormality is noted. No soft tissue changes are seen. IMPRESSION: No acute abnormality noted. Electronically Signed   By: Inez Catalina M.D.   On: 01/11/2018 13:07   Ct Head Wo Contrast  Result Date: 01/11/2018 CLINICAL DATA:  Right-sided headache.  Nausea. EXAM: CT HEAD WITHOUT CONTRAST TECHNIQUE: Contiguous axial images were obtained from the base of the skull through the vertex without intravenous contrast. COMPARISON:  September 17, 2015 FINDINGS: Brain: There is a shunt catheter present from a right frontal approach. The tip of the catheter is slightly posterior to the atrium of the right lateral ventricle with the tip abutting and likely extending into the splenium of the corpus callosum on the right, unchanged. The ventricles appear prominent but stable. The sulci appear normal. There is mild cerebellar tonsillar ectopia. There is mild invagination of CSF into the sella. There is no intracranial mass, hemorrhage, extra-axial fluid collection, or midline shift. No focal brain parenchymal lesions are evident. No evident acute infarct. Vascular: No hyperdense vessel. No appreciable vascular calcification evident. Skull: There is a defect in the right frontal bone at the site of shunt catheter placement. Bony calvarium otherwise appears intact. Sinuses/Orbits: There is patchy opacity in the right sphenoid sinus. Other visualized paranasal sinuses are clear. Visualized orbits appear symmetric bilaterally. Other: Mastoid air cells are clear. IMPRESSION: Ventriculoperitoneal shunt catheter on the right, unchanged in position. Stable prominence of the ventricles with sulci appearing normal. No progression of hydrocephalus since 2017. Cerebellar tonsillar ectopia is stable. Mild invagination of CSF into the sella noted. No brain parenchymal  lesions. No acute infarct. No mass or hemorrhage. There is right sphenoid sinusitis. Electronically Signed   By: Lowella Grip III M.D.   On: 01/11/2018 07:21   EKG Interpretation  Date/Time:  Wednesday January 11 2018 10:09:37 EDT Ventricular Rate:  101 PR Interval:    QRS Duration: 88 QT Interval:  346 QTC Calculation: 449 R Axis:   64 Text Interpretation:  Sinus tachycardia Minimal ST depression, inferior leads Baseline wander in lead(s)  II V6 No STEMI.  Confirmed by Nanda Quinton [phone]) on 01/11/2018 10:28:11 AM   Pending Labs Unresulted Labs (From admission, onward)    Start     Ordered   01/18/18 0500  Creatinine, serum  (enoxaparin (LOVENOX)    CrCl >/= 30 ml/min)  Weekly,   R    Comments:  while on enoxaparin therapy    01/11/18 1541   01/12/18 0500  HIV antibody (Routine Testing)  Tomorrow morning,   R     01/11/18 1541   01/12/18 0500  CBC  Tomorrow morning,   R     01/11/18 1541          Vitals/Pain Today's Vitals   01/11/18 1600 01/11/18 1722 01/11/18 1722 01/11/18 1747  BP: (!) 181/84   (!) 179/113  Pulse: 81   88  Resp: (!) 21   (!) 21  Temp:    100.1 F (37.8 C)  TempSrc:    Oral  SpO2: 100%   96%  PainSc:  8  8      Isolation Precautions No active isolations  Medications Medications  HYDROcodone-acetaminophen (NORCO/VICODIN) 5-325 MG per tablet 1-2 tablet (2 tablets Oral Given 01/11/18 1742)  enoxaparin (LOVENOX) injection 40 mg (has no administration in time range)  acetaminophen (TYLENOL) tablet 650 mg (has no administration in time range)    Or  acetaminophen (TYLENOL) suppository 650 mg (has no administration in time range)  ibuprofen (ADVIL,MOTRIN) tablet 600 mg (has no administration in time range)  hydrochlorothiazide (HYDRODIURIL) tablet 25 mg (25 mg Oral Given 01/11/18 1406)  labetalol (NORMODYNE,TRANDATE) injection 10 mg (10 mg Intravenous Given 01/11/18 1256)  amoxicillin-clavulanate (AUGMENTIN) 875-125 MG per tablet 1 tablet (1  tablet Oral Given 01/11/18 1257)  ondansetron (ZOFRAN-ODT) disintegrating tablet 4 mg (has no administration in time range)    Or  ondansetron (ZOFRAN) injection 4 mg (has no administration in time range)  amLODipine (NORVASC) tablet 10 mg (10 mg Oral Given 01/11/18 1742)  metoCLOPramide (REGLAN) injection 10 mg (10 mg Intravenous Given 01/11/18 0654)  sodium chloride 0.9 % bolus 500 mL (0 mLs Intravenous Stopped 01/11/18 0800)  magnesium sulfate IVPB 2 g 50 mL (0 g Intravenous Stopped 01/11/18 1029)  ketorolac (TORADOL) 30 MG/ML injection 30 mg (30 mg Intravenous Given 01/11/18 0822)  labetalol (NORMODYNE,TRANDATE) injection 5 mg (5 mg Intravenous Given 01/11/18 0822)  labetalol (NORMODYNE,TRANDATE) injection 10 mg (10 mg Intravenous Given 01/11/18 1030)  hydrALAZINE (APRESOLINE) injection 5 mg (5 mg Intravenous Given 01/11/18 1155)    Mobility walks

## 2018-01-11 NOTE — ED Provider Notes (Signed)
12:56 PM Assumed care from Dr. Denton Lank, please see their note for full history, physical and decision making until this point. In brief this is a 42 y.o. year old female who presented to the ED tonight with No chief complaint on file.     History of hydrocephalus status post shunt who presents here with a headache for a few days.  Patient has no other associated symptoms.  Does appear to be slightly hypertensive but unclear if this is related to her headache or the cause of her headache.  No neuro issues.  On my reevaluation patient still with a headache but slightly improved.  Still hypertensive but not as bad as earlier.  Will give a second round of IV headache medication and a little bit of blood pressure control and reevaluate.  Multiple re-evaluations,  multiple IV headache medications and antihypertensive medications and patient still hypertensive with headache.  EKG showed some nonspecific ST changes but no obvious of acute cardiac disease.  Labs unremarkable aside from a white blood cell count of 30.  On reevaluation patient denies any recent fevers, neck pain, confusion, altered mental status, or other infections.  CT scan does show some sinus disease so could be related to that.  Low suspicion for bacterial meningitis at this time.  Secondary to your difficult to treat hypertension and headache concern for possible hypertensive crisis or accelerated hypertension resistant to multiple IV medications so will admit to hospitalist for further management of the same.  CRITICAL CARE Performed by: Marily Memos Total critical care time: 35 minutes Critical care time was exclusive of separately billable procedures and treating other patients. Critical care was necessary to treat or prevent imminent or life-threatening deterioration. Critical care was time spent personally by me on the following activities: development of treatment plan with patient and/or surrogate as well as nursing, discussions with  consultants, evaluation of patient's response to treatment, examination of patient, obtaining history from patient or surrogate, ordering and performing treatments and interventions, ordering and review of laboratory studies, ordering and review of radiographic studies, pulse oximetry and re-evaluation of patient's condition.  Labs, studies and imaging reviewed by myself and considered in medical decision making if ordered. Imaging interpreted by radiology.  Labs Reviewed  CBC WITH DIFFERENTIAL/PLATELET - Abnormal; Notable for the following components:      Result Value   WBC 30.1 (*)    Hemoglobin 11.4 (*)    Neutro Abs 28.3 (*)    Abs Immature Granulocytes 0.21 (*)    All other components within normal limits  COMPREHENSIVE METABOLIC PANEL - Abnormal; Notable for the following components:   Glucose, Bld 121 (*)    Calcium 8.5 (*)    All other components within normal limits  TROPONIN I  BRAIN NATRIURETIC PEPTIDE    CT HEAD WO CONTRAST  Final Result    DG Chest 2 View    (Results Pending)    No follow-ups on file.    Marily Memos, MD 01/11/18 1256

## 2018-01-12 DIAGNOSIS — I169 Hypertensive crisis, unspecified: Secondary | ICD-10-CM | POA: Diagnosis not present

## 2018-01-12 DIAGNOSIS — I10 Essential (primary) hypertension: Secondary | ICD-10-CM

## 2018-01-12 DIAGNOSIS — R509 Fever, unspecified: Secondary | ICD-10-CM | POA: Diagnosis not present

## 2018-01-12 DIAGNOSIS — I160 Hypertensive urgency: Secondary | ICD-10-CM | POA: Diagnosis not present

## 2018-01-12 DIAGNOSIS — R112 Nausea with vomiting, unspecified: Secondary | ICD-10-CM

## 2018-01-12 DIAGNOSIS — H53149 Visual discomfort, unspecified: Secondary | ICD-10-CM

## 2018-01-12 DIAGNOSIS — Z8249 Family history of ischemic heart disease and other diseases of the circulatory system: Secondary | ICD-10-CM

## 2018-01-12 DIAGNOSIS — Z8669 Personal history of other diseases of the nervous system and sense organs: Secondary | ICD-10-CM

## 2018-01-12 DIAGNOSIS — Z982 Presence of cerebrospinal fluid drainage device: Secondary | ICD-10-CM

## 2018-01-12 DIAGNOSIS — R51 Headache: Secondary | ICD-10-CM | POA: Diagnosis not present

## 2018-01-12 DIAGNOSIS — Z720 Tobacco use: Secondary | ICD-10-CM

## 2018-01-12 DIAGNOSIS — A419 Sepsis, unspecified organism: Secondary | ICD-10-CM | POA: Diagnosis not present

## 2018-01-12 LAB — CBC
HCT: 35 % — ABNORMAL LOW (ref 36.0–46.0)
Hemoglobin: 11 g/dL — ABNORMAL LOW (ref 12.0–15.0)
MCH: 27.5 pg (ref 26.0–34.0)
MCHC: 31.4 g/dL (ref 30.0–36.0)
MCV: 87.5 fL (ref 80.0–100.0)
Platelets: 335 10*3/uL (ref 150–400)
RBC: 4 MIL/uL (ref 3.87–5.11)
RDW: 13.2 % (ref 11.5–15.5)
WBC: 29.1 10*3/uL — ABNORMAL HIGH (ref 4.0–10.5)
nRBC: 0 % (ref 0.0–0.2)

## 2018-01-12 LAB — RESPIRATORY PANEL BY PCR

## 2018-01-12 LAB — HIV ANTIBODY (ROUTINE TESTING W REFLEX): HIV Screen 4th Generation wRfx: NONREACTIVE

## 2018-01-12 MED ORDER — LOSARTAN POTASSIUM 50 MG PO TABS
50.0000 mg | ORAL_TABLET | Freq: Every day | ORAL | Status: DC
  Administered 2018-01-12 – 2018-01-13 (×2): 50 mg via ORAL
  Filled 2018-01-12 (×2): qty 1

## 2018-01-12 MED ORDER — LABETALOL HCL 100 MG PO TABS
100.0000 mg | ORAL_TABLET | Freq: Two times a day (BID) | ORAL | Status: DC
  Administered 2018-01-12 – 2018-01-13 (×3): 100 mg via ORAL
  Filled 2018-01-12 (×3): qty 1

## 2018-01-12 NOTE — Progress Notes (Addendum)
PROGRESS NOTE    Meghan Davila   WUJ:[id number]  DOB: Nov 12, 1975  DOA: 01/11/2018 PCP: Patient, No Pcp Per   Brief Narrative:  Meghan Davila is a 42 y.o. female with medical history significant of hydrocephalus status post VP shunt, gestational diabetes, high blood pressure.  Patient presented secondary to 2 days of worsening intermittent frontal headache.  Patient states that the headache is throbbing headache with no radiation.  She has tried ibuprofen 400 mg which was initially helping.  She took some today which did not help her headache.  No sick contacts.  She has not received a flu shot.  No nasal congestion, sneezing, rhinorrhea, postnasal drip.   Subjective: Has clear discharge from her nose ROS: no complaints of nausea, vomiting, constipation diarrhea, cough, dyspnea or dysuria. No other complaints.   Assessment & Plan:   Principal Problem:   Hypertensive urgency - started on a Labetalol infusion with improvement - have started oral Labetalol, Cozaar and HCTZ  Active Problems:   Sepsis  - WBC 30 > 29, fever 100.9 just now - she has no dysuria or cough but has clear nasal discharge, no facial tenderness - will obtain a resp panel - on Augmentin- ? sphenoid sinusitis- no air fluid level on CT- nasal discharge is clear - she continues to have a headache, no neck pain, able to move neck without issues-  have asked for an ID opinion on whether to obtain LP- Dr Orvan Falconer has asked me to speak to NS as well who I have consulted  Nausea, vomited once yesterday - PRN Zofran    Headache x 3 days - Tylenol-    DVT prophylaxis: Lovenox Code Status: Full code Family Communication:  Disposition Plan: will transfer out of SDU as BP improved Consultants:   ID  NS Procedures:   none Antimicrobials:  Anti-infectives (From admission, onward)   Start     Dose/Rate Route Frequency Ordered Stop   01/11/18 1245  amoxicillin-clavulanate (AUGMENTIN) 875-125 MG per tablet 1  tablet     1 tablet Oral Every 12 hours 01/11/18 1234         Objective: Vitals:   01/12/18 0900 01/12/18 1000 01/12/18 1100 01/12/18 1200  BP: (!) 145/88 130/87 134/84 128/71  Pulse: 71 81 80 68  Resp: 15 (!) 22 (!) 21 11  Temp:      TempSrc:      SpO2: 99% 92% 99% 100%    Intake/Output Summary (Last 24 hours) at 01/12/2018 1241 Last data filed at 01/12/2018 1200 Gross per 24 hour  Intake 526.1 ml  Output -  Net 526.1 ml   There were no vitals filed for this visit.  Examination: General exam: Appears comfortable  HEENT: PERRLA, oral mucosa moist, no sclera icterus or thrush Respiratory system: Clear to auscultation. Respiratory effort normal. Cardiovascular system: S1 & S2 heard, RRR.   Gastrointestinal system: Abdomen soft, non-tender, nondistended. Normal bowel sounds. Central nervous system: Alert and oriented. No focal neurological deficits. Extremities: No cyanosis, clubbing or edema Skin: No rashes or ulcers Psychiatry:  Mood & affect appropriate.     Data Reviewed: I have personally reviewed following labs and imaging studies  CBC: Recent Labs  Lab 01/11/18 0955 01/12/18 0309  WBC 30.1* 29.1*  NEUTROABS 28.3*  --   HGB 11.4* 11.0*  HCT 36.3 35.0*  MCV 89.6 87.5  PLT 333 335   Basic Metabolic Panel: Recent Labs  Lab 01/11/18 0955  NA 136  K 3.5  CL  106  CO2 22  GLUCOSE 121*  BUN 11  CREATININE 0.65  CALCIUM 8.5*   GFR: CrCl cannot be calculated (Unknown ideal weight.). Liver Function Tests: Recent Labs  Lab 01/11/18 0955  AST 17  ALT 21  ALKPHOS 53  BILITOT 0.8  PROT 7.1  ALBUMIN 4.0   No results for input(s): LIPASE, AMYLASE in the last 168 hours. No results for input(s): AMMONIA in the last 168 hours. Coagulation Profile: No results for input(s): INR, PROTIME in the last 168 hours. Cardiac Enzymes: Recent Labs  Lab 01/11/18 0955  TROPONINI <0.03   BNP (last 3 results) No results for input(s): PROBNP in the last 8760  hours. HbA1C: No results for input(s): HGBA1C in the last 72 hours. CBG: No results for input(s): GLUCAP in the last 168 hours. Lipid Profile: No results for input(s): CHOL, HDL, LDLCALC, TRIG, CHOLHDL, LDLDIRECT in the last 72 hours. Thyroid Function Tests: No results for input(s): TSH, T4TOTAL, FREET4, T3FREE, THYROIDAB in the last 72 hours. Anemia Panel: No results for input(s): VITAMINB12, FOLATE, FERRITIN, TIBC, IRON, RETICCTPCT in the last 72 hours. Urine analysis:    Component Value Date/Time   COLORURINE YELLOW 01/30/2014 0832   APPEARANCEUR CLEAR 01/30/2014 0832   LABSPEC 1.020 01/30/2014 0832   PHURINE 6.5 01/30/2014 0832   GLUCOSEU NEG 01/30/2014 0832   HGBUR TRACE (A) 01/30/2014 0832   BILIRUBINUR NEG 01/30/2014 0832   KETONESUR NEG 01/30/2014 0832   PROTEINUR NEG 01/30/2014 0832   UROBILINOGEN 1 01/30/2014 0832   NITRITE NEG 01/30/2014 0832   LEUKOCYTESUR NEG 01/30/2014 0832   Sepsis Labs: @LABRCNTIP (procalcitonin:4,lacticidven:4) ) Recent Results (from the past 240 hour(s))  MRSA PCR Screening     Status: None   Collection Time: 01/11/18  9:07 PM  Result Value Ref Range Status   MRSA by PCR NEGATIVE NEGATIVE Final    Comment:        The GeneXpert MRSA Assay (FDA approved for NASAL specimens only), is one component of a comprehensive MRSA colonization surveillance program. It is not intended to diagnose MRSA infection nor to guide or monitor treatment for MRSA infections. Performed at [facility], [address] [place], [place] [address]          Radiology Studies: Dg Chest 2 View  Result Date: 01/11/2018 CLINICAL DATA:  Severe headaches EXAM: CHEST - 2 VIEW COMPARISON:  09/17/2015 FINDINGS: Cardiac shadows within normal limits. Right-sided shunt catheter is noted and intact. The lungs are clear. No bony abnormality is noted. No soft tissue changes are seen. IMPRESSION: No acute abnormality noted. Electronically Signed   By:  Alcide Clever M.D.   On: 01/11/2018 13:07   Ct Head Wo Contrast  Result Date: 01/11/2018 CLINICAL DATA:  Right-sided headache.  Nausea. EXAM: CT HEAD WITHOUT CONTRAST TECHNIQUE: Contiguous axial images were obtained from the base of the skull through the vertex without intravenous contrast. COMPARISON:  September 17, 2015 FINDINGS: Brain: There is a shunt catheter present from a right frontal approach. The tip of the catheter is slightly posterior to the atrium of the right lateral ventricle with the tip abutting and likely extending into the splenium of the corpus callosum on the right, unchanged. The ventricles appear prominent but stable. The sulci appear normal. There is mild cerebellar tonsillar ectopia. There is mild invagination of CSF into the sella. There is no intracranial mass, hemorrhage, extra-axial fluid collection, or midline shift. No focal brain parenchymal lesions are evident. No evident acute infarct. Vascular: No hyperdense  vessel. No appreciable vascular calcification evident. Skull: There is a defect in the right frontal bone at the site of shunt catheter placement. Bony calvarium otherwise appears intact. Sinuses/Orbits: There is patchy opacity in the right sphenoid sinus. Other visualized paranasal sinuses are clear. Visualized orbits appear symmetric bilaterally. Other: Mastoid air cells are clear. IMPRESSION: Ventriculoperitoneal shunt catheter on the right, unchanged in position. Stable prominence of the ventricles with sulci appearing normal. No progression of hydrocephalus since 2017. Cerebellar tonsillar ectopia is stable. Mild invagination of CSF into the sella noted. No brain parenchymal lesions. No acute infarct. No mass or hemorrhage. There is right sphenoid sinusitis. Electronically Signed   By: Bretta Bang III M.D.   On: 01/11/2018 07:21      Scheduled Meds: . amLODipine  10 mg Oral Daily  . amoxicillin-clavulanate  1 tablet Oral Q12H  . enoxaparin (LOVENOX) injection   40 mg Subcutaneous Q24H  . hydrochlorothiazide  25 mg Oral Daily  . labetalol  100 mg Oral BID  . losartan  50 mg Oral Daily   Continuous Infusions: . sodium chloride    . labetalol (NORMODYNE) infusion Stopped (01/12/18 1147)     LOS: 0 days    Time spent in minutes: 35    Calvert Cantor, MD [facility] Pager: [email or web address] Password [email or web address] 01/12/2018, 12:41 PM

## 2018-01-12 NOTE — Consult Note (Signed)
[facility]    Date of Admission:  01/11/2018           Day 1 amoxicillin clavulanate       Reason for Consult: Severe headache    Referring Provider: Dr. Calvert Cantor  Assessment: It is not entirely clear to me what is causing her current headache.  I doubt that it is dysfunction or infection.  This may be a slightly worse version of her chronic, intermittent headaches.  It can also be related to her poorly controlled hypertension.  It is possible it is related to sinusitis although the CT findings are relatively unimpressive.  Fortunately she is better.  Plan: 1. Continue amoxicillin clavulanate 2. I will follow-up in the morning  Principal Problem:   Headache Active Problems:   Sphenoid sinusitis   Hypertensive urgency   Sepsis (HCC)   S/P VP shunt   Scheduled Meds: . amLODipine  10 mg Oral Daily  . amoxicillin-clavulanate  1 tablet Oral Q12H  . enoxaparin (LOVENOX) injection  40 mg Subcutaneous Q24H  . hydrochlorothiazide  25 mg Oral Daily  . labetalol  100 mg Oral BID  . losartan  50 mg Oral Daily   Continuous Infusions: . sodium chloride    . labetalol (NORMODYNE) infusion Stopped (01/12/18 1147)   PRN Meds:.sodium chloride, acetaminophen **OR** acetaminophen, HYDROcodone-acetaminophen, ibuprofen, ondansetron **OR** ondansetron (ZOFRAN) IV  HPI: Meghan Davila is a 42 y.o. female with a history of hypertension, hydrocephalus and previous VP shunt.  She has problems with intermittent headaches.  She developed a frontal headache 2 days ago with some right-sided rhinorrhea.  The headache was much more severe than normal leading to admission yesterday.  She has some low-grade fever, leukocytosis and a very high blood pressures.  CT scan revealed hazy right sphenoid opacification.  There was no air-fluid level.  No other acute abnormalities were noted.  She was started on empiric amoxicillin clavulanate.  Her blood pressure was reduced  and she is feeling much better today.  She says her headache is now intermittent.  She has no headache if she lays still.  She does still have some mild headache if she bends down.   Review of Systems: Review of Systems  Constitutional: Positive for fever and malaise/fatigue. Negative for chills, diaphoresis and weight loss.  HENT: Negative for congestion, ear pain, hearing loss, sinus pain and sore throat.        Rhinorrhea is noted in history of present illness.  Eyes: Positive for photophobia.  Respiratory: Negative for cough, sputum production and shortness of breath.   Cardiovascular: Negative for chest pain.  Gastrointestinal: Positive for nausea and vomiting. Negative for abdominal pain and diarrhea.  Genitourinary: Negative for dysuria.  Musculoskeletal: Negative for back pain and neck pain.  Skin: Negative for rash.  Neurological: Positive for headaches.  Psychiatric/Behavioral:       She notes that her headaches are frequently worse when she is feeling stressed.  She used to work for [employer].  She will go to work in p.m.  2 months ago she started working at [employer] and has to be up and at work at 5 AM.  She is felt under more stress because of this change.    Past Medical History:  Diagnosis Date  . Gestational diabetes   . PIH (pregnancy induced hypertension)     Social History   Tobacco Use  . Smoking  status: Light Tobacco Smoker    Types: Cigarettes  . Smokeless tobacco: Never Used  . Tobacco comment: smokes 2 cigs daily  Substance Use Topics  . Alcohol use: No  . Drug use: No    Family History  Problem Relation Age of Onset  . Hypertension Mother   . Hypertension Sister   . Hypertension Maternal Grandmother   . Cancer Maternal Grandmother   . Hypertension Maternal Grandfather   . Diabetes Maternal Grandfather    No Known Allergies  OBJECTIVE: Blood pressure (!) 143/84, pulse 81, temperature 99.6 F (37.6  C), temperature source Oral, resp. rate 16, SpO2 100 %.  Physical Exam  Constitutional: She is oriented to person, place, and time.  She is pleasant and in good spirits.  She is resting quietly in bed watching television.  HENT:  Mouth/Throat: No oropharyngeal exudate.  She has no pain, redness or swelling around her VP shunt posterior to her right ear.  Also no inflammation noted along the path of the shunt to her right upper quadrant.  Eyes: Conjunctivae are normal.  Neck: Neck supple.  Cardiovascular: Normal rate, regular rhythm and normal heart sounds.  Pulmonary/Chest: Effort normal and breath sounds normal. She has no wheezes. She has no rales.  Abdominal: Soft. She exhibits no distension. There is no tenderness.  Musculoskeletal: Normal range of motion. She exhibits no edema or tenderness.  Neurological: She is alert and oriented to person, place, and time.  Skin: No rash noted.  Psychiatric: She has a normal mood and affect.    Lab Results Lab Results  Component Value Date   WBC 29.1 (H) 01/12/2018   HGB 11.0 (L) 01/12/2018   HCT 35.0 (L) 01/12/2018   MCV 87.5 01/12/2018   PLT 335 01/12/2018    Lab Results  Component Value Date   CREATININE 0.65 01/11/2018   BUN 11 01/11/2018   NA 136 01/11/2018   K 3.5 01/11/2018   CL 106 01/11/2018   CO2 22 01/11/2018    Lab Results  Component Value Date   ALT 21 01/11/2018   AST 17 01/11/2018   ALKPHOS 53 01/11/2018   BILITOT 0.8 01/11/2018     Microbiology: Recent Results (from the past 240 hour(s))  MRSA PCR Screening     Status: None   Collection Time: 01/11/18  9:07 PM  Result Value Ref Range Status   MRSA by PCR NEGATIVE NEGATIVE Final    Comment:        The GeneXpert MRSA Assay (FDA approved for NASAL specimens only), is one component of a comprehensive MRSA colonization surveillance program. It is not intended to diagnose MRSA infection nor to guide or monitor treatment for MRSA infections. Performed at  [facility], [address], [place], [place] [address]   Respiratory Panel by PCR     Status: None   Collection Time: 01/12/18 12:48 PM  Result Value Ref Range Status   Adenovirus NOT DETECTED NOT DETECTED Final   Coronavirus 229E NOT DETECTED NOT DETECTED Final   Coronavirus HKU1 NOT DETECTED NOT DETECTED Final   Coronavirus NL63 NOT DETECTED NOT DETECTED Final   Coronavirus OC43 NOT DETECTED NOT DETECTED Final   Metapneumovirus NOT DETECTED NOT DETECTED Final   Rhinovirus / Enterovirus NOT DETECTED NOT DETECTED Final   Influenza A NOT DETECTED NOT DETECTED Final   Influenza B NOT DETECTED NOT DETECTED Final   Parainfluenza Virus 1 NOT DETECTED NOT DETECTED Final   Parainfluenza Virus 2 NOT DETECTED NOT  DETECTED Final   Parainfluenza Virus 3 NOT DETECTED NOT DETECTED Final   Parainfluenza Virus 4 NOT DETECTED NOT DETECTED Final   Respiratory Syncytial Virus NOT DETECTED NOT DETECTED Final   Bordetella pertussis NOT DETECTED NOT DETECTED Final   Chlamydophila pneumoniae NOT DETECTED NOT DETECTED Final   Mycoplasma pneumoniae NOT DETECTED NOT DETECTED Final    Comment: Performed at [facility], [address], [place], [place] [address]  Culture, blood (Routine X 2) w Reflex to ID Panel     Status: None (Preliminary result)   Collection Time: 01/12/18  3:22 PM  Result Value Ref Range Status   Specimen Description   Final    BLOOD RIGHT ARM Performed at [facility], [address] [place], [place] [address]    Special Requests   Final    BOTTLES DRAWN AEROBIC AND ANAEROBIC Blood Culture adequate volume Performed at [facility], [address] [place], [place] [address]    Culture PENDING  Incomplete   Report Status PENDING  Incomplete  Culture, blood (Routine X 2) w Reflex to ID Panel     Status: None (Preliminary result)   Collection Time: 01/12/18  3:22 PM  Result Value Ref Range Status   Specimen Description   Final     BLOOD LEFT HAND Performed at [facility], [address], [place], [place] [address]    Special Requests   Final    BOTTLES DRAWN AEROBIC AND ANAEROBIC Blood Culture adequate volume Performed at [facility], [address] [place], [place] [address]    Culture PENDING  Incomplete   Report Status PENDING  Incomplete    Cliffton Asters, MD [facility] [phone] pager   [phone] cell 01/12/2018, 6:04 PM

## 2018-01-13 DIAGNOSIS — J0130 Acute sphenoidal sinusitis, unspecified: Secondary | ICD-10-CM | POA: Diagnosis not present

## 2018-01-13 DIAGNOSIS — I160 Hypertensive urgency: Secondary | ICD-10-CM | POA: Diagnosis not present

## 2018-01-13 DIAGNOSIS — Z982 Presence of cerebrospinal fluid drainage device: Secondary | ICD-10-CM | POA: Diagnosis not present

## 2018-01-13 DIAGNOSIS — I169 Hypertensive crisis, unspecified: Secondary | ICD-10-CM | POA: Diagnosis not present

## 2018-01-13 DIAGNOSIS — R05 Cough: Secondary | ICD-10-CM | POA: Diagnosis not present

## 2018-01-13 DIAGNOSIS — R51 Headache: Secondary | ICD-10-CM | POA: Diagnosis not present

## 2018-01-13 DIAGNOSIS — D72829 Elevated white blood cell count, unspecified: Secondary | ICD-10-CM

## 2018-01-13 DIAGNOSIS — J323 Chronic sphenoidal sinusitis: Secondary | ICD-10-CM

## 2018-01-13 LAB — CBC
HCT: 39.7 % (ref 36.0–46.0)
Hemoglobin: 12.4 g/dL (ref 12.0–15.0)
MCH: 27.5 pg (ref 26.0–34.0)
MCHC: 31.2 g/dL (ref 30.0–36.0)
MCV: 88 fL (ref 80.0–100.0)
Platelets: 373 10*3/uL (ref 150–400)
RBC: 4.51 MIL/uL (ref 3.87–5.11)
RDW: 13.2 % (ref 11.5–15.5)
WBC: 23.5 10*3/uL — ABNORMAL HIGH (ref 4.0–10.5)
nRBC: 0 % (ref 0.0–0.2)

## 2018-01-13 MED ORDER — HYDROCHLOROTHIAZIDE 25 MG PO TABS: 25.0000 mg | ORAL_TABLET | Freq: Every day | ORAL | 0 refills | Status: DC

## 2018-01-13 MED ORDER — AMOXICILLIN-POT CLAVULANATE 875-125 MG PO TABS: 1.0000 | ORAL_TABLET | Freq: Two times a day (BID) | ORAL | 0 refills | Status: DC

## 2018-01-13 MED ORDER — ONDANSETRON 4 MG PO TBDP: 4.0000 mg | ORAL_TABLET | Freq: Three times a day (TID) | ORAL | 0 refills | Status: DC | PRN

## 2018-01-13 MED ORDER — IBUPROFEN 600 MG PO TABS: 600.0000 mg | ORAL_TABLET | Freq: Four times a day (QID) | ORAL | 0 refills | Status: DC | PRN

## 2018-01-13 MED ORDER — ACETAMINOPHEN 325 MG PO TABS: 650.0000 mg | ORAL_TABLET | Freq: Four times a day (QID) | ORAL | Status: DC | PRN

## 2018-01-13 MED ORDER — LABETALOL HCL 100 MG PO TABS: 100.0000 mg | ORAL_TABLET | Freq: Two times a day (BID) | ORAL | 0 refills | Status: DC

## 2018-01-13 MED ORDER — LOSARTAN POTASSIUM 50 MG PO TABS: 50.0000 mg | ORAL_TABLET | Freq: Every day | ORAL | 0 refills | Status: DC

## 2018-01-13 MED ORDER — AMLODIPINE BESYLATE 10 MG PO TABS: 10.0000 mg | ORAL_TABLET | Freq: Every day | ORAL | 0 refills | Status: AC

## 2018-01-13 NOTE — Care Management Note (Addendum)
Case Management Note  Patient Details  Name: Meghan Davila MRN: [id number] Date of Birth: [birth year]  Subjective/Objective:                  Being transferred to med surg from sdu Discharged Action/Plan: No discharge needs present at this time.  Expected Discharge Date:  (unknown)               Expected Discharge Plan:  Home/Self Care  In-House Referral:     Discharge planning Services  CM Consult  Post Acute Care Choice:    Choice offered to:     DME Arranged:    DME Agency:     HH Arranged:    HH Agency:     Status of Service:  In process, will continue to follow  If discussed at Long Length of Stay Meetings, dates discussed:    Additional Comments:  Golda Acre, RN 01/13/2018, 9:42 AM

## 2018-01-13 NOTE — Progress Notes (Signed)
Patient ID: Meghan Davila, female   DOB: Aug 06, 1975, 42 y.o.   MRN: [id number]         [facility]  Date of Admission:  01/11/2018           Day 2 amoxicillin clavulanate ASSESSMENT: I am not absolutely certain what is causing her headache but she is improving with time and empiric coverage for acute sphenoid sinusitis.  PLAN: 1. Continue amoxicillin clavulanate for 7 days total 2. Please call me for any infectious disease questions this weekend  Principal Problem:   Headache Active Problems:   Sphenoid sinusitis   Hypertensive urgency   Sepsis (HCC)   S/P VP shunt   Scheduled Meds: . amLODipine  10 mg Oral Daily  . amoxicillin-clavulanate  1 tablet Oral Q12H  . enoxaparin (LOVENOX) injection  40 mg Subcutaneous Q24H  . hydrochlorothiazide  25 mg Oral Daily  . labetalol  100 mg Oral BID  . losartan  50 mg Oral Daily   Continuous Infusions: . sodium chloride     PRN Meds:.sodium chloride, acetaminophen **OR** acetaminophen, HYDROcodone-acetaminophen, ibuprofen, ondansetron **OR** ondansetron (ZOFRAN) IV   SUBJECTIVE: She is still having some intermittent headache but is feeling better.  She is also still having some very slight right-sided rhinorrhea.  Review of Systems: Review of Systems  Constitutional: Negative for chills, diaphoresis and fever.  Neurological: Positive for headaches.    No Known Allergies  OBJECTIVE: Vitals:   01/12/18 2324 01/13/18 0400 01/13/18 0839 01/13/18 0844  BP:    (!) 162/95  Pulse:    89  Resp:      Temp: 99.2 F (37.3 C) 99 F (37.2 C) 99.1 F (37.3 C)   TempSrc: Oral Oral Oral   SpO2:       There is no height or weight on file to calculate BMI.  Physical Exam  Constitutional:  She is resting quietly in bed watching television.  HENT:  No inflammation along the course of her VP shunt.  Eyes: Conjunctivae are normal.  Neck: Neck supple.  Psychiatric: She has a normal mood and affect.     Lab Results Lab Results  Component Value Date   WBC 23.5 (H) 01/13/2018   HGB 12.4 01/13/2018   HCT 39.7 01/13/2018   MCV 88.0 01/13/2018   PLT 373 01/13/2018    Lab Results  Component Value Date   CREATININE 0.65 01/11/2018   BUN 11 01/11/2018   NA 136 01/11/2018   K 3.5 01/11/2018   CL 106 01/11/2018   CO2 22 01/11/2018    Lab Results  Component Value Date   ALT 21 01/11/2018   AST 17 01/11/2018   ALKPHOS 53 01/11/2018   BILITOT 0.8 01/11/2018     Microbiology: Recent Results (from the past 240 hour(s))  MRSA PCR Screening     Status: None   Collection Time: 01/11/18  9:07 PM  Result Value Ref Range Status   MRSA by PCR NEGATIVE NEGATIVE Final    Comment:        The GeneXpert MRSA Assay (FDA approved for NASAL specimens only), is one component of a comprehensive MRSA colonization surveillance program. It is not intended to diagnose MRSA infection nor to guide or monitor treatment for MRSA infections. Performed at [facility], [address], [place], [place] [address]   Respiratory Panel by PCR     Status: None   Collection Time: 01/12/18 12:48 PM  Result Value Ref Range Status  Adenovirus NOT DETECTED NOT DETECTED Final   Coronavirus 229E NOT DETECTED NOT DETECTED Final   Coronavirus HKU1 NOT DETECTED NOT DETECTED Final   Coronavirus NL63 NOT DETECTED NOT DETECTED Final   Coronavirus OC43 NOT DETECTED NOT DETECTED Final   Metapneumovirus NOT DETECTED NOT DETECTED Final   Rhinovirus / Enterovirus NOT DETECTED NOT DETECTED Final   Influenza A NOT DETECTED NOT DETECTED Final   Influenza B NOT DETECTED NOT DETECTED Final   Parainfluenza Virus 1 NOT DETECTED NOT DETECTED Final   Parainfluenza Virus 2 NOT DETECTED NOT DETECTED Final   Parainfluenza Virus 3 NOT DETECTED NOT DETECTED Final   Parainfluenza Virus 4 NOT DETECTED NOT DETECTED Final   Respiratory Syncytial Virus NOT DETECTED NOT DETECTED Final   Bordetella pertussis NOT  DETECTED NOT DETECTED Final   Chlamydophila pneumoniae NOT DETECTED NOT DETECTED Final   Mycoplasma pneumoniae NOT DETECTED NOT DETECTED Final    Comment: Performed at [facility], [address] [place], [place] [address]  Culture, blood (Routine X 2) w Reflex to ID Panel     Status: None (Preliminary result)   Collection Time: 01/12/18  3:22 PM  Result Value Ref Range Status   Specimen Description   Final    BLOOD RIGHT ARM Performed at [facility], [address], [place], [place] [address]    Special Requests   Final    BOTTLES DRAWN AEROBIC AND ANAEROBIC Blood Culture adequate volume Performed at [facility], [address], [place], [place] [address]    Culture PENDING  Incomplete   Report Status PENDING  Incomplete  Culture, blood (Routine X 2) w Reflex to ID Panel     Status: None (Preliminary result)   Collection Time: 01/12/18  3:22 PM  Result Value Ref Range Status   Specimen Description   Final    BLOOD LEFT HAND Performed at [facility], [address] [place], [place] [address]    Special Requests   Final    BOTTLES DRAWN AEROBIC AND ANAEROBIC Blood Culture adequate volume Performed at [facility], [address] [place], [place] [address]    Culture PENDING  Incomplete   Report Status PENDING  Incomplete    Cliffton Asters, MD [facility] [phone] pager   [phone] cell 01/13/2018, 10:57 AM

## 2018-01-13 NOTE — Discharge Instructions (Signed)
Please find a PCP as soon as possible.  Buy a BP monitor and start checking your BP 2 times a day and keep a record of this to take to your doctor.  Start to take brisk walks every other day for at least 30 min each and try to lose 5 lb in the month of November.  No more than 4-6 small cups of water a day.   Low-Sodium Eating Plan Sodium, which is an element that makes up salt, helps you maintain a healthy balance of fluids in your body. Too much sodium can increase your blood pressure and cause fluid and waste to be held in your body. Your health care provider or dietitian may recommend following this plan if you have high blood pressure (hypertension), kidney disease, liver disease, or heart failure. Eating less sodium can help lower your blood pressure, reduce swelling, and protect your heart, liver, and kidneys. What are tips for following this plan? General guidelines  Most people on this plan should limit their sodium intake to 1,500-2,000 mg (milligrams) of sodium each day. Reading food labels  The Nutrition Facts label lists the amount of sodium in one serving of the food. If you eat more than one serving, you must multiply the listed amount of sodium by the number of servings.  Choose foods with less than 140 mg of sodium per serving.  Avoid foods with 300 mg of sodium or more per serving. Shopping  Look for lower-sodium products, often labeled as "low-sodium" or "no salt added."  Always check the sodium content even if foods are labeled as "unsalted" or "no salt added".  Buy fresh foods. ? Avoid canned foods and premade or frozen meals. ? Avoid canned, cured, or processed meats  Buy breads that have less than 80 mg of sodium per slice. Cooking  Eat more home-cooked food and less restaurant, buffet, and fast food.  Avoid adding salt when cooking. Use salt-free seasonings or herbs instead of table salt or sea salt. Check with your health care provider or pharmacist before  using salt substitutes.  Cook with plant-based oils, such as canola, sunflower, or olive oil. Meal planning  When eating at a restaurant, ask that your food be prepared with less salt or no salt, if possible.  Avoid foods that contain MSG (monosodium glutamate). MSG is sometimes added to [place] food, bouillon, and some canned foods. What foods are recommended? The items listed may not be a complete list. Talk with your dietitian about what dietary choices are best for you. Grains Low-sodium cereals, including oats, puffed wheat and rice, and shredded wheat. Low-sodium crackers. Unsalted rice. Unsalted pasta. Low-sodium bread. Whole-grain breads and whole-grain pasta. Vegetables Fresh or frozen vegetables. "No salt added" canned vegetables. "No salt added" tomato sauce and paste. Low-sodium or reduced-sodium tomato and vegetable juice. Fruits Fresh, frozen, or canned fruit. Fruit juice. Meats and other protein foods Fresh or frozen (no salt added) meat, poultry, seafood, and fish. Low-sodium canned tuna and salmon. Unsalted nuts. Dried peas, beans, and lentils without added salt. Unsalted canned beans. Eggs. Unsalted nut butters. Dairy Milk. Soy milk. Cheese that is naturally low in sodium, such as ricotta cheese, fresh mozzarella, or Swiss cheese Low-sodium or reduced-sodium cheese. Cream cheese. Yogurt. Fats and oils Unsalted butter. Unsalted margarine with no trans fat. Vegetable oils such as canola or olive oils. Seasonings and other foods Fresh and dried herbs and spices. Salt-free seasonings. Low-sodium mustard and ketchup. Sodium-free salad dressing. Sodium-free light mayonnaise. Fresh or  refrigerated horseradish. Lemon juice. Vinegar. Homemade, reduced-sodium, or low-sodium soups. Unsalted popcorn and pretzels. Low-salt or salt-free chips. What foods are not recommended? The items listed may not be a complete list. Talk with your dietitian about what dietary choices are best for  you. Grains Instant hot cereals. Bread stuffing, pancake, and biscuit mixes. Croutons. Seasoned rice or pasta mixes. Noodle soup cups. Boxed or frozen macaroni and cheese. Regular salted crackers. Self-rising flour. Vegetables Sauerkraut, pickled vegetables, and relishes. Olives. [place] fries. Onion rings. Regular canned vegetables (not low-sodium or reduced-sodium). Regular canned tomato sauce and paste (not low-sodium or reduced-sodium). Regular tomato and vegetable juice (not low-sodium or reduced-sodium). Frozen vegetables in sauces. Meats and other protein foods Meat or fish that is salted, canned, smoked, spiced, or pickled. Bacon, ham, sausage, hotdogs, corned beef, chipped beef, packaged lunch meats, salt pork, jerky, pickled herring, anchovies, regular canned tuna, sardines, salted nuts. Dairy Processed cheese and cheese spreads. Cheese curds. Blue cheese. Feta cheese. String cheese. Regular cottage cheese. Buttermilk. Canned milk. Fats and oils Salted butter. Regular margarine. Ghee. Bacon fat. Seasonings and other foods Onion salt, garlic salt, seasoned salt, table salt, and sea salt. Canned and packaged gravies. Worcestershire sauce. Tartar sauce. Barbecue sauce. Teriyaki sauce. Soy sauce, including reduced-sodium. Steak sauce. Fish sauce. Oyster sauce. Cocktail sauce. Horseradish that you find on the shelf. Regular ketchup and mustard. Meat flavorings and tenderizers. Bouillon cubes. Hot sauce and Tabasco sauce. Premade or packaged marinades. Premade or packaged taco seasonings. Relishes. Regular salad dressings. Salsa. Potato and tortilla chips. Corn chips and puffs. Salted popcorn and pretzels. Canned or dried soups. Pizza. Frozen entrees and pot pies. Summary  Eating less sodium can help lower your blood pressure, reduce swelling, and protect your heart, liver, and kidneys.  Most people on this plan should limit their sodium intake to 1,500-2,000 mg (milligrams) of sodium each  day.  Canned, boxed, and frozen foods are high in sodium. Restaurant foods, fast foods, and pizza are also very high in sodium. You also get sodium by adding salt to food.  Try to cook at home, eat more fresh fruits and vegetables, and eat less fast food, canned, processed, or prepared foods. This information is not intended to replace advice given to you by your health care provider. Make sure you discuss any questions you have with your health care provider. Document Released: 08/21/2001 Document Revised: 02/23/2016 Document Reviewed: 02/23/2016 Elsevier Interactive Patient Education  [employer].

## 2018-01-13 NOTE — Discharge Summary (Signed)
Physician Discharge Summary  JAMISEN HAWES ZOX:[id number] DOB: [birth year] DOA: 01/11/2018  PCP: Patient, No Pcp Per  Admit date: 01/11/2018 Discharge date: 01/13/2018  Admitted From: home  Disposition:  home   Recommendations for Outpatient Follow-up:  1. Recommended to find PCP ASAP   Discharge Condition:  Stable   CODE STATUS:  Full code   Consultations:  ID    Discharge Diagnoses:  Principal Problem:   Headache Active Problems:   Sepsis (HCC)   Hypertensive urgency   Sphenoid sinusitis   S/P VP shunt     Brief Summary: MAKHAYLA MCMURRY is a 42 y.o.femalewith medical history significant ofhydrocephalus status post VP shunt, gestational diabetes, high blood pressure.Patient presented secondary to 2 days of worsening intermittent frontal headache. Patient states that the headache is throbbing headache with no radiation. She has tried ibuprofen 400 mg which was initially helping. She took some today which did not help her headache. No sick contacts. She has not received a flu shot. No nasal congestion, sneezing, rhinorrhea, postnasal drip.  Hospital Course:  Principal Problem:   Hypertensive urgency - started on a Labetalol infusion with improvement - have started oral Labetalol, Cozaar and HCTZ- she states she is able to tell the improvemet in her physically not that BP has improved - discussed dietary changes, exercise and weight loss along with adherence with medications and the need to find a PCP  Active Problems:   Sepsis  - WBC 30 > 29> 23 -  fever 100.9 - improving - she has no dysuria or cough but has clear nasal discharge, no facial tenderness- had nausea and an episode of vomiting the day of admission -  resp panel is negative - blood cultures negative - noted to have headache but no stiff neck- d/w NS, Dr Fabiola Backer- he stated that risk of infection quite low this far out from shunt placement - started on Augmentin on admission for possible sphenoid  sinusitis- no air fluid level on CT- nasal discharge is clear but no other source of infection noted - ID consulted and agreed with continuing Augmentin- she is feeling better today- fever curve improved and leukocytosis improving- ok to d/c with total 1 wk of Augmentin   Nausea, vomited once   - has resolved     Headache x 3 days - has h/o headaches intermittently  - has resolved with improvement in BP   Discharge Exam: Vitals:   01/13/18 1100 01/13/18 1203  BP: 132/60   Pulse: 70   Resp: 18   Temp:  98.4 F (36.9 C)  SpO2: 94%    Vitals:   01/13/18 0900 01/13/18 1000 01/13/18 1100 01/13/18 1203  BP: (!) 150/69 129/70 132/60   Pulse: 70 69 70   Resp: 15 16 18    Temp:    98.4 F (36.9 C)  TempSrc:    Oral  SpO2: 100% 98% 94%     General: Pt is alert, awake, not in acute distress Cardiovascular: RRR, S1/S2 +, no rubs, no gallops Respiratory: CTA bilaterally, no wheezing, no rhonchi Abdominal: Soft, NT, ND, bowel sounds + Extremities: no edema, no cyanosis   Discharge Instructions  Discharge Instructions    Diet - low sodium heart healthy   Complete by:  As directed    Increase activity slowly   Complete by:  As directed      Allergies as of 01/13/2018   No Known Allergies     Medication List    STOP taking these medications  methocarbamol 500 MG tablet Commonly known as:  ROBAXIN     TAKE these medications   acetaminophen 325 MG tablet Commonly known as:  TYLENOL Take 2 tablets (650 mg total) by mouth every 6 (six) hours as needed for mild pain (or Fever >/= 101).   amLODipine 10 MG tablet Commonly known as:  NORVASC Take 1 tablet (10 mg total) by mouth daily.   amoxicillin-clavulanate 875-125 MG tablet Commonly known as:  AUGMENTIN Take 1 tablet by mouth every 12 (twelve) hours.   aspirin-acetaminophen-caffeine 250-250-65 MG tablet Commonly known as:  EXCEDRIN MIGRAINE Take 2 tablets by mouth every 6 (six) hours as needed for headache.    hydrochlorothiazide 25 MG tablet Commonly known as:  HYDRODIURIL Take 1 tablet (25 mg total) by mouth daily.   ibuprofen 600 MG tablet Commonly known as:  ADVIL,MOTRIN Take 1 tablet (600 mg total) by mouth every 6 (six) hours as needed for headache or moderate pain. What changed:    medication strength  how much to take  when to take this  reasons to take this   labetalol 100 MG tablet Commonly known as:  NORMODYNE Take 1 tablet (100 mg total) by mouth 2 (two) times daily.   losartan 50 MG tablet Commonly known as:  COZAAR Take 1 tablet (50 mg total) by mouth daily.   ondansetron 4 MG disintegrating tablet Commonly known as:  ZOFRAN-ODT Take 1 tablet (4 mg total) by mouth every 8 (eight) hours as needed for nausea or vomiting.      Follow-up Information    [employer]. Call.   Why:  Please call the number on the back of your insurance card to find an in-network primary doctor to establish care with.         No Known Allergies   Procedures/Studies:    Dg Chest 2 View  Result Date: 01/11/2018 CLINICAL DATA:  Severe headaches EXAM: CHEST - 2 VIEW COMPARISON:  09/17/2015 FINDINGS: Cardiac shadows within normal limits. Right-sided shunt catheter is noted and intact. The lungs are clear. No bony abnormality is noted. No soft tissue changes are seen. IMPRESSION: No acute abnormality noted. Electronically Signed   By: Alcide Clever M.D.   On: 01/11/2018 13:07   Ct Head Wo Contrast  Result Date: 01/11/2018 CLINICAL DATA:  Right-sided headache.  Nausea. EXAM: CT HEAD WITHOUT CONTRAST TECHNIQUE: Contiguous axial images were obtained from the base of the skull through the vertex without intravenous contrast. COMPARISON:  September 17, 2015 FINDINGS: Brain: There is a shunt catheter present from a right frontal approach. The tip of the catheter is slightly posterior to the atrium of the right lateral ventricle with the tip abutting and likely extending into the splenium  of the corpus callosum on the right, unchanged. The ventricles appear prominent but stable. The sulci appear normal. There is mild cerebellar tonsillar ectopia. There is mild invagination of CSF into the sella. There is no intracranial mass, hemorrhage, extra-axial fluid collection, or midline shift. No focal brain parenchymal lesions are evident. No evident acute infarct. Vascular: No hyperdense vessel. No appreciable vascular calcification evident. Skull: There is a defect in the right frontal bone at the site of shunt catheter placement. Bony calvarium otherwise appears intact. Sinuses/Orbits: There is patchy opacity in the right sphenoid sinus. Other visualized paranasal sinuses are clear. Visualized orbits appear symmetric bilaterally. Other: Mastoid air cells are clear. IMPRESSION: Ventriculoperitoneal shunt catheter on the right, unchanged in position. Stable prominence of the ventricles with sulci  appearing normal. No progression of hydrocephalus since 2017. Cerebellar tonsillar ectopia is stable. Mild invagination of CSF into the sella noted. No brain parenchymal lesions. No acute infarct. No mass or hemorrhage. There is right sphenoid sinusitis. Electronically Signed   By: Bretta Bang III M.D.   On: 01/11/2018 07:21      The results of significant diagnostics from this hospitalization (including imaging, microbiology, ancillary and laboratory) are listed below for reference.     Microbiology: Recent Results (from the past 240 hour(s))  MRSA PCR Screening     Status: None   Collection Time: 01/11/18  9:07 PM  Result Value Ref Range Status   MRSA by PCR NEGATIVE NEGATIVE Final    Comment:        The GeneXpert MRSA Assay (FDA approved for NASAL specimens only), is one component of a comprehensive MRSA colonization surveillance program. It is not intended to diagnose MRSA infection nor to guide or monitor treatment for MRSA infections. Performed at [facility],  [address], [place], [place] [address]   Respiratory Panel by PCR     Status: None   Collection Time: 01/12/18 12:48 PM  Result Value Ref Range Status   Adenovirus NOT DETECTED NOT DETECTED Final   Coronavirus 229E NOT DETECTED NOT DETECTED Final   Coronavirus HKU1 NOT DETECTED NOT DETECTED Final   Coronavirus NL63 NOT DETECTED NOT DETECTED Final   Coronavirus OC43 NOT DETECTED NOT DETECTED Final   Metapneumovirus NOT DETECTED NOT DETECTED Final   Rhinovirus / Enterovirus NOT DETECTED NOT DETECTED Final   Influenza A NOT DETECTED NOT DETECTED Final   Influenza B NOT DETECTED NOT DETECTED Final   Parainfluenza Virus 1 NOT DETECTED NOT DETECTED Final   Parainfluenza Virus 2 NOT DETECTED NOT DETECTED Final   Parainfluenza Virus 3 NOT DETECTED NOT DETECTED Final   Parainfluenza Virus 4 NOT DETECTED NOT DETECTED Final   Respiratory Syncytial Virus NOT DETECTED NOT DETECTED Final   Bordetella pertussis NOT DETECTED NOT DETECTED Final   Chlamydophila pneumoniae NOT DETECTED NOT DETECTED Final   Mycoplasma pneumoniae NOT DETECTED NOT DETECTED Final    Comment: Performed at [facility], [address], [place], [place] [address]  Culture, blood (Routine X 2) w Reflex to ID Panel     Status: None (Preliminary result)   Collection Time: 01/12/18  3:22 PM  Result Value Ref Range Status   Specimen Description   Final    BLOOD RIGHT ARM Performed at [facility], [address], [place], [place] [address]    Special Requests   Final    BOTTLES DRAWN AEROBIC AND ANAEROBIC Blood Culture adequate volume Performed at [facility], [address] [place], [place] [address]    Culture   Final    NO GROWTH < 24 HOURS Performed at [facility], [address] [place], [place] [address]    Report Status PENDING  Incomplete  Culture, blood (Routine X 2) w Reflex to ID Panel     Status: None (Preliminary result)   Collection Time: 01/12/18  3:22 PM  Result  Value Ref Range Status   Specimen Description   Final    BLOOD LEFT HAND Performed at [facility], [address] [place], [place] [address]    Special Requests   Final    BOTTLES DRAWN AEROBIC AND ANAEROBIC Blood Culture adequate volume Performed at [facility], [address] [place],  [place] [address]    Culture   Final    NO GROWTH < 24 HOURS Performed at [facility], [address], [place], [place] [address]    Report Status PENDING  Incomplete     Labs: BNP (last 3 results) Recent Labs    01/11/18 0955  BNP 68.9   Basic Metabolic Panel: Recent Labs  Lab 01/11/18 0955  NA 136  K 3.5  CL 106  CO2 22  GLUCOSE 121*  BUN 11  CREATININE 0.65  CALCIUM 8.5*   Liver Function Tests: Recent Labs  Lab 01/11/18 0955  AST 17  ALT 21  ALKPHOS 53  BILITOT 0.8  PROT 7.1  ALBUMIN 4.0   No results for input(s): LIPASE, AMYLASE in the last 168 hours. No results for input(s): AMMONIA in the last 168 hours. CBC: Recent Labs  Lab 01/11/18 0955 01/12/18 0309 01/13/18 0800  WBC 30.1* 29.1* 23.5*  NEUTROABS 28.3*  --   --   HGB 11.4* 11.0* 12.4  HCT 36.3 35.0* 39.7  MCV 89.6 87.5 88.0  PLT 333 335 373   Cardiac Enzymes: Recent Labs  Lab 01/11/18 0955  TROPONINI <0.03   BNP: Invalid input(s): POCBNP CBG: No results for input(s): GLUCAP in the last 168 hours. D-Dimer No results for input(s): DDIMER in the last 72 hours. Hgb A1c No results for input(s): HGBA1C in the last 72 hours. Lipid Profile No results for input(s): CHOL, HDL, LDLCALC, TRIG, CHOLHDL, LDLDIRECT in the last 72 hours. Thyroid function studies No results for input(s): TSH, T4TOTAL, T3FREE, THYROIDAB in the last 72 hours.  Invalid input(s): FREET3 Anemia work up No results for input(s): VITAMINB12, FOLATE, FERRITIN, TIBC, IRON, RETICCTPCT in the last 72 hours. Urinalysis    Component Value Date/Time   COLORURINE YELLOW 01/30/2014 0832   APPEARANCEUR  CLEAR 01/30/2014 0832   LABSPEC 1.020 01/30/2014 0832   PHURINE 6.5 01/30/2014 0832   GLUCOSEU NEG 01/30/2014 0832   HGBUR TRACE (A) 01/30/2014 0832   BILIRUBINUR NEG 01/30/2014 0832   KETONESUR NEG 01/30/2014 0832   PROTEINUR NEG 01/30/2014 0832   UROBILINOGEN 1 01/30/2014 0832   NITRITE NEG 01/30/2014 0832   LEUKOCYTESUR NEG 01/30/2014 0832   Sepsis Labs Invalid input(s): PROCALCITONIN,  WBC,  LACTICIDVEN Microbiology Recent Results (from the past 240 hour(s))  MRSA PCR Screening     Status: None   Collection Time: 01/11/18  9:07 PM  Result Value Ref Range Status   MRSA by PCR NEGATIVE NEGATIVE Final    Comment:        The GeneXpert MRSA Assay (FDA approved for NASAL specimens only), is one component of a comprehensive MRSA colonization surveillance program. It is not intended to diagnose MRSA infection nor to guide or monitor treatment for MRSA infections. Performed at [facility], [address] [place], [place] [address]   Respiratory Panel by PCR     Status: None   Collection Time: 01/12/18 12:48 PM  Result Value Ref Range Status   Adenovirus NOT DETECTED NOT DETECTED Final   Coronavirus 229E NOT DETECTED NOT DETECTED Final   Coronavirus HKU1 NOT DETECTED NOT DETECTED Final   Coronavirus NL63 NOT DETECTED NOT DETECTED Final   Coronavirus OC43 NOT DETECTED NOT DETECTED Final   Metapneumovirus NOT DETECTED NOT DETECTED Final   Rhinovirus / Enterovirus NOT DETECTED NOT DETECTED Final   Influenza A NOT DETECTED NOT DETECTED Final   Influenza B NOT DETECTED NOT DETECTED Final   Parainfluenza Virus 1  NOT DETECTED NOT DETECTED Final   Parainfluenza Virus 2 NOT DETECTED NOT DETECTED Final   Parainfluenza Virus 3 NOT DETECTED NOT DETECTED Final   Parainfluenza Virus 4 NOT DETECTED NOT DETECTED Final   Respiratory Syncytial Virus NOT DETECTED NOT DETECTED Final   Bordetella pertussis NOT DETECTED NOT DETECTED Final   Chlamydophila pneumoniae NOT  DETECTED NOT DETECTED Final   Mycoplasma pneumoniae NOT DETECTED NOT DETECTED Final    Comment: Performed at [facility], [address], [place], [place] [address]  Culture, blood (Routine X 2) w Reflex to ID Panel     Status: None (Preliminary result)   Collection Time: 01/12/18  3:22 PM  Result Value Ref Range Status   Specimen Description   Final    BLOOD RIGHT ARM Performed at [facility], [address] [place], [place] [address]    Special Requests   Final    BOTTLES DRAWN AEROBIC AND ANAEROBIC Blood Culture adequate volume Performed at [facility], [address] [place], [place] [address]    Culture   Final    NO GROWTH < 24 HOURS Performed at [facility], [address] [place], [place] [address]    Report Status PENDING  Incomplete  Culture, blood (Routine X 2) w Reflex to ID Panel     Status: None (Preliminary result)   Collection Time: 01/12/18  3:22 PM  Result Value Ref Range Status   Specimen Description   Final    BLOOD LEFT HAND Performed at [facility], [address] [place], [place] [address]    Special Requests   Final    BOTTLES DRAWN AEROBIC AND ANAEROBIC Blood Culture adequate volume Performed at [facility], [address], [place], [place] [address]    Culture   Final    NO GROWTH < 24 HOURS Performed at [facility], [address] [place], [place] [address]    Report Status PENDING  Incomplete     Time coordinating discharge in minutes: 55  SIGNED:   Calvert Cantor, MD  [facility] 01/13/2018, 2:57 PM Pager   If 7PM-7AM, please contact night-coverage [email or web address] Password [email or web address]

## 2018-01-17 LAB — CULTURE, BLOOD (ROUTINE X 2)
Culture: NO GROWTH
Culture: NO GROWTH
Special Requests: ADEQUATE
Special Requests: ADEQUATE

## 2018-02-02 ENCOUNTER — Inpatient Hospital Stay: Payer: BC Managed Care – PPO | Admitting: Family Medicine

## 2018-10-11 ENCOUNTER — Other Ambulatory Visit: Payer: Self-pay | Admitting: Obstetrics & Gynecology

## 2018-10-18 ENCOUNTER — Other Ambulatory Visit: Payer: Self-pay

## 2018-10-18 ENCOUNTER — Encounter (HOSPITAL_BASED_OUTPATIENT_CLINIC_OR_DEPARTMENT_OTHER): Payer: Self-pay | Admitting: *Deleted

## 2018-10-18 NOTE — Progress Notes (Signed)
Spoke with patient via telephone for pre op interview. NPO after MN. Patient to take Labetelol and Norvasc AM of surgery with a sip of water. Patient will need UPT and ISTAT 8 AM of surgery. CBC will be drawn pre op on 10/20/2018. Arrival time 1130.

## 2018-10-20 ENCOUNTER — Encounter (HOSPITAL_COMMUNITY)
Admission: RE | Admit: 2018-10-20 | Discharge: 2018-10-20 | Disposition: A | Discharge status: Home / Self Care | Payer: BC Managed Care – PPO | Source: Ambulatory Visit | Attending: Obstetrics & Gynecology | Admitting: Obstetrics & Gynecology

## 2018-10-20 ENCOUNTER — Other Ambulatory Visit: Payer: Self-pay

## 2018-10-20 ENCOUNTER — Other Ambulatory Visit (HOSPITAL_COMMUNITY)
Admission: RE | Admit: 2018-10-20 | Discharge: 2018-10-20 | Disposition: A | Discharge status: Home / Self Care | Payer: BC Managed Care – PPO | Source: Ambulatory Visit | Attending: Obstetrics & Gynecology | Admitting: Obstetrics & Gynecology

## 2018-10-20 DIAGNOSIS — Z20828 Contact with and (suspected) exposure to other viral communicable diseases: Secondary | ICD-10-CM | POA: Diagnosis not present

## 2018-10-20 LAB — CBC
HCT: 37.1 % (ref 36.0–46.0)
Hemoglobin: 11.5 g/dL — ABNORMAL LOW (ref 12.0–15.0)
MCH: 27.8 pg (ref 26.0–34.0)
MCHC: 31 g/dL (ref 30.0–36.0)
MCV: 89.6 fL (ref 80.0–100.0)
Platelets: 462 10*3/uL — ABNORMAL HIGH (ref 150–400)
RBC: 4.14 MIL/uL (ref 3.87–5.11)
RDW: 13.2 % (ref 11.5–15.5)
WBC: 11.9 10*3/uL — ABNORMAL HIGH (ref 4.0–10.5)
nRBC: 0 % (ref 0.0–0.2)

## 2018-10-20 LAB — BASIC METABOLIC PANEL
Anion gap: 10 (ref 5–15)
BUN: 14 mg/dL (ref 6–20)
CO2: 25 mmol/L (ref 22–32)
Calcium: 9.2 mg/dL (ref 8.9–10.3)
Chloride: 105 mmol/L (ref 98–111)
Creatinine, Ser: 0.66 mg/dL (ref 0.44–1.00)
GFR calc Af Amer: >60 mL/min (ref >60)
GFR calc non Af Amer: >60 mL/min (ref >60)
Glucose, Bld: 86 mg/dL (ref 70–99)
Potassium: 3.5 mmol/L (ref 3.5–5.1)
Sodium: 140 mmol/L (ref 135–145)

## 2018-10-20 LAB — SARS CORONAVIRUS 2 (TAT 6-24 HRS): SARS Coronavirus 2: NEGATIVE

## 2018-10-24 ENCOUNTER — Ambulatory Visit (HOSPITAL_BASED_OUTPATIENT_CLINIC_OR_DEPARTMENT_OTHER): Payer: BC Managed Care – PPO | Admitting: Anesthesiology

## 2018-10-24 ENCOUNTER — Ambulatory Visit (HOSPITAL_BASED_OUTPATIENT_CLINIC_OR_DEPARTMENT_OTHER)
Admission: RE | Admit: 2018-10-24 | Discharge: 2018-10-24 | Disposition: A | Discharge status: Home / Self Care | Payer: BC Managed Care – PPO | Attending: Obstetrics & Gynecology | Admitting: Obstetrics & Gynecology

## 2018-10-24 ENCOUNTER — Encounter (HOSPITAL_BASED_OUTPATIENT_CLINIC_OR_DEPARTMENT_OTHER)
Admission: RE | Disposition: A | Discharge status: Home / Self Care | Payer: Self-pay | Source: Home / Self Care | Attending: Obstetrics & Gynecology

## 2018-10-24 ENCOUNTER — Encounter (HOSPITAL_BASED_OUTPATIENT_CLINIC_OR_DEPARTMENT_OTHER): Payer: Self-pay

## 2018-10-24 DIAGNOSIS — Z79899 Other long term (current) drug therapy: Secondary | ICD-10-CM | POA: Diagnosis not present

## 2018-10-24 DIAGNOSIS — F17210 Nicotine dependence, cigarettes, uncomplicated: Secondary | ICD-10-CM | POA: Insufficient documentation

## 2018-10-24 DIAGNOSIS — Z302 Encounter for sterilization: Secondary | ICD-10-CM | POA: Insufficient documentation

## 2018-10-24 DIAGNOSIS — I10 Essential (primary) hypertension: Secondary | ICD-10-CM | POA: Diagnosis not present

## 2018-10-24 HISTORY — PX: LAPAROSCOPIC TUBAL LIGATION: SHX1937

## 2018-10-24 LAB — POCT I-STAT, CHEM 8
BUN: 12 mg/dL (ref 6–20)
Calcium, Ion: 1.19 mmol/L (ref 1.15–1.40)
Chloride: 103 mmol/L (ref 98–111)
Creatinine, Ser: 0.6 mg/dL (ref 0.44–1.00)
Glucose, Bld: 88 mg/dL (ref 70–99)
HCT: 34 % — ABNORMAL LOW (ref 36.0–46.0)
Hemoglobin: 11.6 g/dL — ABNORMAL LOW (ref 12.0–15.0)
Potassium: 3 mmol/L — ABNORMAL LOW (ref 3.5–5.1)
Sodium: 141 mmol/L (ref 135–145)
TCO2: 26 mmol/L (ref 22–32)

## 2018-10-24 LAB — POCT PREGNANCY, URINE: Preg Test, Ur: NEGATIVE

## 2018-10-24 SURGERY — LIGATION, FALLOPIAN TUBE, LAPAROSCOPIC
Anesthesia: General | Site: Abdomen | Laterality: Bilateral

## 2018-10-24 MED ORDER — FENTANYL CITRATE (PF) 100 MCG/2ML IJ SOLN
25.0000 ug | INTRAMUSCULAR | Status: DC | PRN
  Administered 2018-10-24 (×2): 25 ug via INTRAVENOUS
  Filled 2018-10-24: qty 1

## 2018-10-24 MED ORDER — BUPIVACAINE HCL (PF) 0.25 % IJ SOLN
INTRAMUSCULAR | Status: DC | PRN
  Administered 2018-10-24: 10 mL

## 2018-10-24 MED ORDER — ROCURONIUM BROMIDE 10 MG/ML (PF) SYRINGE
PREFILLED_SYRINGE | INTRAVENOUS | Status: AC
  Filled 2018-10-24: qty 10

## 2018-10-24 MED ORDER — MIDAZOLAM HCL 2 MG/2ML IJ SOLN
INTRAMUSCULAR | Status: AC
  Filled 2018-10-24: qty 2

## 2018-10-24 MED ORDER — ONDANSETRON HCL 4 MG/2ML IJ SOLN
INTRAMUSCULAR | Status: AC
  Filled 2018-10-24: qty 2

## 2018-10-24 MED ORDER — KETOROLAC TROMETHAMINE 30 MG/ML IJ SOLN
INTRAMUSCULAR | Status: AC
  Filled 2018-10-24: qty 1

## 2018-10-24 MED ORDER — PROPOFOL 10 MG/ML IV BOLUS
INTRAVENOUS | Status: AC
  Filled 2018-10-24: qty 20

## 2018-10-24 MED ORDER — FENTANYL CITRATE (PF) 100 MCG/2ML IJ SOLN
INTRAMUSCULAR | Status: AC
  Filled 2018-10-24: qty 2

## 2018-10-24 MED ORDER — OXYCODONE HCL 5 MG/5ML PO SOLN
5.0000 mg | Freq: Once | ORAL | Status: DC | PRN
  Filled 2018-10-24: qty 5

## 2018-10-24 MED ORDER — OXYCODONE-ACETAMINOPHEN 5-325 MG PO TABS: 1.0000 | ORAL_TABLET | Freq: Four times a day (QID) | ORAL | 0 refills | Status: DC | PRN

## 2018-10-24 MED ORDER — MIDAZOLAM HCL 2 MG/2ML IJ SOLN
INTRAMUSCULAR | Status: DC | PRN
  Administered 2018-10-24: 2 mg via INTRAVENOUS

## 2018-10-24 MED ORDER — ONDANSETRON HCL 4 MG/2ML IJ SOLN
4.0000 mg | Freq: Once | INTRAMUSCULAR | Status: DC | PRN
  Filled 2018-10-24: qty 2

## 2018-10-24 MED ORDER — OXYCODONE HCL 5 MG PO TABS
5.0000 mg | ORAL_TABLET | Freq: Once | ORAL | Status: DC | PRN
  Filled 2018-10-24: qty 1

## 2018-10-24 MED ORDER — MEPERIDINE HCL 25 MG/ML IJ SOLN
6.2500 mg | INTRAMUSCULAR | Status: DC | PRN
  Filled 2018-10-24: qty 1

## 2018-10-24 MED ORDER — LIDOCAINE 2% (20 MG/ML) 5 ML SYRINGE
INTRAMUSCULAR | Status: DC | PRN
  Administered 2018-10-24: 60 mg via INTRAVENOUS

## 2018-10-24 MED ORDER — LACTATED RINGERS IV SOLN
INTRAVENOUS | Status: DC
  Administered 2018-10-24: 12:00:00 via INTRAVENOUS
  Filled 2018-10-24: qty 1000

## 2018-10-24 MED ORDER — ONDANSETRON HCL 4 MG/2ML IJ SOLN
INTRAMUSCULAR | Status: DC | PRN
  Administered 2018-10-24: 4 mg via INTRAVENOUS

## 2018-10-24 MED ORDER — ROCURONIUM BROMIDE 10 MG/ML (PF) SYRINGE
PREFILLED_SYRINGE | INTRAVENOUS | Status: DC | PRN
  Administered 2018-10-24: 50 mg via INTRAVENOUS

## 2018-10-24 MED ORDER — PROPOFOL 10 MG/ML IV BOLUS
INTRAVENOUS | Status: DC | PRN
  Administered 2018-10-24: 160 mg via INTRAVENOUS

## 2018-10-24 MED ORDER — DEXAMETHASONE SODIUM PHOSPHATE 10 MG/ML IJ SOLN
INTRAMUSCULAR | Status: DC | PRN
  Administered 2018-10-24: 5 mg via INTRAVENOUS

## 2018-10-24 MED ORDER — FENTANYL CITRATE (PF) 100 MCG/2ML IJ SOLN
INTRAMUSCULAR | Status: DC | PRN
  Administered 2018-10-24 (×3): 50 ug via INTRAVENOUS

## 2018-10-24 MED ORDER — ACETAMINOPHEN 325 MG PO TABS
325.0000 mg | ORAL_TABLET | ORAL | Status: DC | PRN
  Filled 2018-10-24: qty 2

## 2018-10-24 MED ORDER — ACETAMINOPHEN 160 MG/5ML PO SOLN
325.0000 mg | ORAL | Status: DC | PRN
  Filled 2018-10-24: qty 20.3

## 2018-10-24 MED ORDER — DEXAMETHASONE SODIUM PHOSPHATE 10 MG/ML IJ SOLN
INTRAMUSCULAR | Status: AC
  Filled 2018-10-24: qty 1

## 2018-10-24 SURGICAL SUPPLY — 30 items
CATH ROBINSON RED A/P 16FR (CATHETERS) ×2 IMPLANT
COVER MAYO STAND STRL (DRAPES) ×2 IMPLANT
COVER WAND RF STERILE (DRAPES) ×2 IMPLANT
DERMABOND ADVANCED (GAUZE/BANDAGES/DRESSINGS) ×1
DERMABOND ADVANCED .7 DNX12 (GAUZE/BANDAGES/DRESSINGS) ×1 IMPLANT
DRSG OPSITE POSTOP 3X4 (GAUZE/BANDAGES/DRESSINGS) IMPLANT
DURAPREP 26ML APPLICATOR (WOUND CARE) ×2 IMPLANT
GAUZE 4X4 16PLY RFD (DISPOSABLE) ×2 IMPLANT
GLOVE BIO SURGEON STRL SZ7 (GLOVE) ×4 IMPLANT
GLOVE BIOGEL PI IND STRL 7.0 (GLOVE) ×3 IMPLANT
GLOVE BIOGEL PI IND STRL 7.5 (GLOVE) ×1 IMPLANT
GLOVE BIOGEL PI INDICATOR 7.0 (GLOVE) ×3
GLOVE BIOGEL PI INDICATOR 7.5 (GLOVE) ×1
GOWN STRL REUS W/TWL LRG LVL3 (GOWN DISPOSABLE) ×4 IMPLANT
LIGASURE VESSEL 5MM BLUNT TIP (ELECTROSURGICAL) ×2 IMPLANT
PACK LAPAROSCOPY BASIN (CUSTOM PROCEDURE TRAY) ×2 IMPLANT
PACK TRENDGUARD 450 HYBRID PRO (MISCELLANEOUS) ×1 IMPLANT
PACK TRENDGUARD 600 HYBRD PROC (MISCELLANEOUS) IMPLANT
PROTECTOR NERVE ULNAR (MISCELLANEOUS) ×4 IMPLANT
SLEEVE XCEL OPT CAN 5 100 (ENDOMECHANICALS) IMPLANT
SUT VICRYL 0 UR6 27IN ABS (SUTURE) ×2 IMPLANT
SUT VICRYL 4-0 PS2 18IN ABS (SUTURE) ×2 IMPLANT
TOWEL OR 17X26 10 PK STRL BLUE (TOWEL DISPOSABLE) ×2 IMPLANT
TRENDGUARD 450 HYBRID PRO PACK (MISCELLANEOUS) ×2
TRENDGUARD 600 HYBRID PROC PK (MISCELLANEOUS)
TROCAR BALLN 12MMX100 BLUNT (TROCAR) ×2 IMPLANT
TROCAR BLADELESS OPT 5 100 (ENDOMECHANICALS) ×4 IMPLANT
TROCAR XCEL NON-BLD 5MMX100MML (ENDOMECHANICALS) IMPLANT
TUBING EVAC SMOKE HEATED PNEUM (TUBING) ×2 IMPLANT
WARMER LAPAROSCOPE (MISCELLANEOUS) ×2 IMPLANT

## 2018-10-24 NOTE — Transfer of Care (Signed)
Immediate Anesthesia Transfer of Care Note  Patient: Meghan Davila  Procedure(s) Performed: Procedure(s) (LRB): LAPAROSCOPIC TUBAL LIGATION By Salpingectomy (Bilateral)  Patient Location: PACU  Anesthesia Type: General  Level of Consciousness: awake, oriented, sedated and patient cooperative  Airway & Oxygen Therapy: Patient Spontanous Breathing and Patient connected to face mask oxygen  Post-op Assessment: Report given to PACU RN and Post -op Vital signs reviewed and stable  Post vital signs: Reviewed and stable  Complications: No apparent anesthesia complications  Last Vitals:  Vitals Value Taken Time  BP 137/88 10/24/18 1434  Temp 36.7 C 10/24/18 1434  Pulse 86 10/24/18 1436  Resp 15 10/24/18 1436  SpO2 100 % 10/24/18 1436  Vitals shown include unvalidated device data.  Last Pain:  Vitals:   10/24/18 1156  TempSrc: Oral  PainSc: 0-No pain      Patients Stated Pain Goal: 6 (10/24/18 1156)

## 2018-10-24 NOTE — Anesthesia Procedure Notes (Signed)
Procedure Name: Intubation Date/Time: 10/24/2018 1:42 PM Performed by: Suan Halter, CRNA Pre-anesthesia Checklist: Patient identified, Emergency Drugs available, Suction available and Patient being monitored Patient Re-evaluated:Patient Re-evaluated prior to induction Oxygen Delivery Method: Circle system utilized Preoxygenation: Pre-oxygenation with 100% oxygen Induction Type: IV induction Ventilation: Mask ventilation without difficulty Laryngoscope Size: Mac and 3 Grade View: Grade I Tube type: Oral Tube size: 7.0 mm Number of attempts: 1 Airway Equipment and Method: Stylet and Oral airway Placement Confirmation: ETT inserted through vocal cords under direct vision,  positive ETCO2 and breath sounds checked- equal and bilateral Secured at: 21 cm Tube secured with: Tape Dental Injury: Teeth and Oropharynx as per pre-operative assessment

## 2018-10-24 NOTE — Discharge Instructions (Signed)
DISCHARGE INSTRUCTIONS: Laparoscopy  The following instructions have been prepared to help you care for yourself upon your return home today.  Wound care:  Do not get the incision wet for the first 24 hours. The incision should be kept clean and dry.  The Band-Aids or dressings may be removed the day after surgery.  Should the incision become sore, red, and swollen after the first week, check with your doctor.  Personal hygiene:  Shower the day after your procedure.  Activity and limitations:  Do NOT drive or operate any equipment today.  Do NOT lift anything more than 15 pounds for 2-3 weeks after surgery.  Do NOT rest in bed all day.  Walking is encouraged. Walk each day, starting slowly with 5-minute walks 3 or 4 times a day. Slowly increase the length of your walks.  Walk up and down stairs slowly.  Do NOT do strenuous activities, such as golfing, playing tennis, bowling, running, biking, weight lifting, gardening, mowing, or vacuuming for 2-4 weeks. Ask your doctor when it is okay to start.  Diet: Eat a light meal as desired this evening. You may resume your usual diet tomorrow.  Return to work: This is dependent on the type of work you do. For the most part you can return to a desk job within a week of surgery. If you are more active at work, please discuss this with your doctor.  What to expect after your surgery: You may have a slight burning sensation when you urinate on the first day. You may have a very small amount of blood in the urine. Expect to have a small amount of vaginal discharge/light bleeding for 1-2 weeks. It is not unusual to have abdominal soreness and bruising for up to 2 weeks. You may be tired and need more rest for about 1 week. You may experience shoulder pain for 24-72 hours. Lying flat in bed may relieve it.  Call your doctor for any of the following:  Develop a fever of 100.4 or greater  Inability to urinate 6 hours after discharge from hospital  Severe  pain not relieved by pain medications  Persistent of heavy bleeding at incision site  Redness or swelling around incision site after a week  Increasing nausea or vomiting  Patient Signature________________________________________ Nurse Signature_________________________________________ Post Anesthesia Home Care Instructions  Activity: Get plenty of rest for the remainder of the day. A responsible adult should stay with you for 24 hours following the procedure.  For the next 24 hours, DO NOT: -Drive a car -Operate machinery -Drink alcoholic beverages -Take any medication unless instructed by your physician -Make any legal decisions or sign important papers.  Meals: Start with liquid foods such as gelatin or soup. Progress to regular foods as tolerated. Avoid greasy, spicy, heavy foods. If nausea and/or vomiting occur, drink only clear liquids until the nausea and/or vomiting subsides. Call your physician if vomiting continues.  Special Instructions/Symptoms: Your throat may feel dry or sore from the anesthesia or the breathing tube placed in your throat during surgery. If this causes discomfort, gargle with warm salt water. The discomfort should disappear within 24 hours.  If you had a scopolamine patch placed behind your ear for the management of post- operative nausea and/or vomiting:  1. The medication in the patch is effective for 72 hours, after which it should be removed.  Wrap patch in a tissue and discard in the trash. Wash hands thoroughly with soap and water. 2. You may remove the patch   earlier than 72 hours if you experience unpleasant side effects which may include dry mouth, dizziness or visual disturbances. 3. Avoid touching the patch. Wash your hands with soap and water after contact with the patch.   

## 2018-10-24 NOTE — Op Note (Signed)
Preoperative diagnosis: Multiparity, permanent sterilization desired Postoperative diagnosis: Same Procedure: Laparoscopic bilateral tubal sterilization by bilateral salpingectomy  Surgeon: Dr Azucena Fallen, MD Assistants: none Anesthesia Gen. Endotracheal IV fluids LR 600 cc  EBL minimal 10 cc Urine clear (straight cath pre-op) 50 cc Complications none Disposition PACU and home Specimens Bilateral fallopian tubes   Procedure Patient is 43 yo, [id number] female who desired permanent sterilization via tubal ligation. All options of contraception and sterilization were reviewed including IUDs, Essure, as well as vasectomy. Patient declined other options. Risk and complications of surgery including infection, bleeding, damage to internal organs, other complications including pneumonia, VTE were reviewed. Also discussed irreversibility as well as failure and risk of ectopic pregnancy. Patient voiced understanding. Informed written consent was obtained and patient was brought to the operating room with IV running. Timeout was carried out. She underwent general anesthesia without difficulty and was given dorsal lithotomy position with left arm tucked. Examination under anesthesia revealed anteverted normal size uterus, no adnexal mass, normal cervix. Parts prepped and draped in standard fashion. Bladder was emptied with straight catheter with clear urine. Speculum was placed anterior lip of cervix was grasped with tenaculum, Hulka manipulator placed.  Gloves, gown changed attention was focused on the abdomen. A transverse 10 mm incision was made at the upper edge of umbilicus after injecting 5 cc Marcaine. Incision was carried down to the fascia was incised peritoneal entry was confirmed. Stay suture of 0 Vicryl was taken on the fascia and Hassan cannula was introduced. Insufflation was begun with CO2. Patient was given Trendelenburg position. A 0 laparoscope was introduced. Internal organs appear normal  including normal bowel appendix normal uterus, tubes and ovaries. Left tube was buried at the proximal end in adhesions but ampulla and fimbria seen well.   Two lower quadrants ports placed under vision via 5 mm incisions after injecting marcaine in avascular windows. Marylene grasper used to elevate the tube and Ligasure used for desiccation and incision of mesosalpinx to perform bilateral salpingectomy, left side was partial salpingectomy due to isthmic portion being buried in lateral adhesions. Both tubes passed off for path. Hemostasis was excellent.  All instruments were removed, pneumoperitoneum was released. Stay suture at the fascia were tied off with adequate closure. The skin was approximated using 4-0 Vicryl in subcuticular fashion. Dermabond was applied. Uterine manipulator and tenaculum removed. Hemostasis was excellent.   All counts were correct x2.  patient was reversed from general anesthesia extubated and brought out to the recovery room in stable condition. Plan is to discharge home from recovery room.  Followup with Dr. Benjie Karvonen in office in 2 weeks. I performed this surgery. --V.Benjie Karvonen ,MD

## 2018-10-24 NOTE — Anesthesia Preprocedure Evaluation (Addendum)
Anesthesia Evaluation  Patient identified by MRN, date of birth, ID band Patient awake    Reviewed: Allergy & Precautions, H&P , NPO status , Patient's Chart, lab work & pertinent test results, reviewed documented beta blocker date and time   Airway Mallampati: I  TM Distance: >3 FB Neck ROM: full    Dental no notable dental hx. (+) Teeth Intact, Dental Advisory Given   Pulmonary neg pulmonary ROS, Current Smoker,    Pulmonary exam normal breath sounds clear to auscultation       Cardiovascular Exercise Tolerance: Good hypertension, Pt. on medications and Pt. on home beta blockers  Rhythm:regular Rate:Normal     Neuro/Psych negative neurological ROS  negative psych ROS   GI/Hepatic negative GI ROS, Neg liver ROS,   Endo/Other  negative endocrine ROSdiabetes, Gestational  Renal/GU negative Renal ROS  negative genitourinary   Musculoskeletal   Abdominal   Peds  Hematology negative hematology ROS (+)   Anesthesia Other Findings   Reproductive/Obstetrics negative OB ROS                            Anesthesia Physical Anesthesia Plan  ASA: II  Anesthesia Plan: General   Post-op Pain Management:    Induction:   PONV Risk Score and Plan: 3 and Ondansetron, Treatment may vary due to age or medical condition and Dexamethasone  Airway Management Planned: Oral ETT and LMA  Additional Equipment:   Intra-op Plan:   Post-operative Plan:   Informed Consent: I have reviewed the patients History and Physical, chart, labs and discussed the procedure including the risks, benefits and alternatives for the proposed anesthesia with the patient or authorized representative who has indicated his/her understanding and acceptance.     Dental Advisory Given  Plan Discussed with: CRNA, Anesthesiologist and Surgeon  Anesthesia Plan Comments:         Anesthesia Quick Evaluation

## 2018-10-24 NOTE — Anesthesia Postprocedure Evaluation (Signed)
Anesthesia Post Note  Patient: Meghan Davila  Procedure(s) Performed: LAPAROSCOPIC TUBAL LIGATION By Salpingectomy (Bilateral Abdomen)     Patient location during evaluation: PACU Anesthesia Type: General Level of consciousness: awake and alert Pain management: pain level controlled Vital Signs Assessment: post-procedure vital signs reviewed and stable Respiratory status: spontaneous breathing, nonlabored ventilation, respiratory function stable and patient connected to nasal cannula oxygen Cardiovascular status: blood pressure returned to baseline and stable Postop Assessment: no apparent nausea or vomiting Anesthetic complications: no    Last Vitals:  Vitals:   10/24/18 1515 10/24/18 1530  BP: (!) 152/91 135/89  Pulse: 84 89  Resp: 16 12  Temp:    SpO2: 100% 96%    Last Pain:  Vitals:   10/24/18 1530  TempSrc:   PainSc: 5                  Teliyah Royal

## 2018-10-24 NOTE — H&P (Signed)
Meghan Davila is an 43 y.o. female here for permanent sterilization surgery. [id number].  HTN, Hydrocephalous (shunt), lap chole, C/s x 2  Regular menses, Nl Paps, no STIs, no breast complaints, nl mammograms   Patient's last menstrual period was 10/14/2018 (approximate).    Past Medical History:  Diagnosis Date  . Gestational diabetes   . PIH (pregnancy induced hypertension)     Past Surgical History:  Procedure Laterality Date  . BRAIN SURGERY  2001   shunt put in  . BREAST REDUCTION SURGERY  2008  . BREAST SURGERY Bilateral 2008   reduction  . CESAREAN SECTION  10/22/09  . CESAREAN SECTION  2002  . CHOLECYSTECTOMY  2004    Family History  Problem Relation Age of Onset  . Hypertension Mother   . Hypertension Sister   . Hypertension Maternal Grandmother   . Cancer Maternal Grandmother   . Hypertension Maternal Grandfather   . Diabetes Maternal Grandfather     Social History:  reports that she has been smoking cigarettes. She has never used smokeless tobacco. She reports that she does not drink alcohol or use drugs.  Allergies: No Known Allergies  Medications Prior to Admission  Medication Sig Dispense Refill Last Dose  . acetaminophen (TYLENOL) 325 MG tablet Take 2 tablets (650 mg total) by mouth every 6 (six) hours as needed for mild pain (or Fever >/= 101).   10/20/2018  . amLODipine (NORVASC) 10 MG tablet Take 1 tablet (10 mg total) by mouth daily. 30 tablet 0 10/24/2018 at 0800  . hydrochlorothiazide (HYDRODIURIL) 25 MG tablet Take 1 tablet (25 mg total) by mouth daily. 30 tablet 0 10/20/2018  . losartan (COZAAR) 50 MG tablet Take 1 tablet (50 mg total) by mouth daily. 30 tablet 0 10/24/2018 at 0800  . ondansetron (ZOFRAN-ODT) 4 MG disintegrating tablet Take 1 tablet (4 mg total) by mouth every 8 (eight) hours as needed for nausea or vomiting. 20 tablet 0 Past Month at Unknown time  . aspirin-acetaminophen-caffeine (EXCEDRIN MIGRAINE) 250-250-65 MG tablet Take 2 tablets by  mouth every 6 (six) hours as needed for headache.   More than a month at Unknown time  . ibuprofen (ADVIL,MOTRIN) 600 MG tablet Take 1 tablet (600 mg total) by mouth every 6 (six) hours as needed for headache or moderate pain. 30 tablet 0 More than a month at Unknown time  . labetalol (NORMODYNE) 100 MG tablet Take 1 tablet (100 mg total) by mouth 2 (two) times daily. 60 tablet 0 Unknown at Unknown time    ROS neg for SOB/chest pain/ HA/ vision changes/ LE pain or swelling/ etc.  Blood pressure 129/80, pulse 65, temperature 97.6 F (36.4 C), temperature source Oral, resp. rate 16, height 5\' 1"  (1.549 m), weight 75 kg, last menstrual period 10/14/2018, SpO2 100 %. Physical Exam3 Physical exam:  A&O x 3, no acute distress. Pleasant HEENT neg, no thyromegaly Lungs CTA bilat CV RRR, S1S2 normal Abdo soft, non tender, non acute Extr no edema/ tenderness Pelvic nl uterus and cx    Results for orders placed or performed during the hospital encounter of 10/24/18 (from the past 24 hour(s))  I-STAT, chem 8     Status: Abnormal   Collection Time: 10/24/18 12:14 PM  Result Value Ref Range   Sodium 141 135 - 145 mmol/L   Potassium 3.0 (L) 3.5 - 5.1 mmol/L   Chloride 103 98 - 111 mmol/L   BUN 12 6 - 20 mg/dL   Creatinine, Ser 8.290.60 0.44 -  1.00 mg/dL   Glucose, Bld 88 70 - 99 mg/dL   Calcium, Ion 1.19 1.15 - 1.40 mmol/L   TCO2 26 22 - 32 mmol/L   Hemoglobin 11.6 (L) 12.0 - 15.0 g/dL   HCT 34.0 (L) 36.0 - 46.0 %  Pregnancy, urine POC     Status: None   Collection Time: 10/24/18 12:16 PM  Result Value Ref Range   Preg Test, Ur NEGATIVE NEGATIVE    No results found.  Assessment/Plan: 43 yo with 2 kids. Here for permanent sterilization by choice. All contraceptive options discussed and she wants Laparoscopic tubal sterilization by salpingectomy Increased risks due to prior surgeries d/w pt. Risks/complications of surgery reviewed incl infection, bleeding, damage to internal organs including  bladder, bowels, ureters, blood vessels, other risks from anesthesia, VTE and delayed complications of any surgery, complications in future surgery reviewed. Also discussed irreversibility, risk of regret Salpingectomy if possible for ovarian cancer risk reduction d/w pt, agrees   Elveria Royals 10/24/2018, 12:55 PM

## 2018-10-25 ENCOUNTER — Encounter (HOSPITAL_BASED_OUTPATIENT_CLINIC_OR_DEPARTMENT_OTHER): Payer: Self-pay | Admitting: Obstetrics & Gynecology

## 2019-05-17 ENCOUNTER — Other Ambulatory Visit: Payer: Self-pay | Admitting: Obstetrics & Gynecology

## 2019-05-17 DIAGNOSIS — R1909 Other intra-abdominal and pelvic swelling, mass and lump: Secondary | ICD-10-CM

## 2019-05-17 DIAGNOSIS — R1033 Periumbilical pain: Secondary | ICD-10-CM

## 2019-05-18 ENCOUNTER — Ambulatory Visit
Admission: RE | Admit: 2019-05-18 | Discharge: 2019-05-18 | Disposition: A | Discharge status: Home / Self Care | Payer: BC Managed Care – PPO | Source: Ambulatory Visit | Attending: Obstetrics & Gynecology | Admitting: Obstetrics & Gynecology

## 2019-05-18 DIAGNOSIS — R1033 Periumbilical pain: Secondary | ICD-10-CM

## 2019-05-18 DIAGNOSIS — R1909 Other intra-abdominal and pelvic swelling, mass and lump: Secondary | ICD-10-CM

## 2019-05-26 ENCOUNTER — Other Ambulatory Visit: Payer: Self-pay | Admitting: Obstetrics & Gynecology

## 2019-05-26 DIAGNOSIS — K429 Umbilical hernia without obstruction or gangrene: Secondary | ICD-10-CM

## 2019-06-04 ENCOUNTER — Ambulatory Visit
Admission: RE | Admit: 2019-06-04 | Discharge: 2019-06-04 | Disposition: A | Discharge status: Home / Self Care | Payer: BC Managed Care – PPO | Source: Ambulatory Visit | Attending: Obstetrics & Gynecology | Admitting: Obstetrics & Gynecology

## 2019-06-04 DIAGNOSIS — K429 Umbilical hernia without obstruction or gangrene: Secondary | ICD-10-CM

## 2019-06-04 MED ORDER — IOPAMIDOL (ISOVUE-300) INJECTION 61%
100.0000 mL | Freq: Once | INTRAVENOUS | Status: AC | PRN
  Administered 2019-06-04: 100 mL via INTRAVENOUS

## 2019-07-10 ENCOUNTER — Ambulatory Visit: Payer: Self-pay | Admitting: General Surgery

## 2019-07-12 NOTE — Progress Notes (Signed)
[facility] [id number] - [place], [place] - [address] [place] [address] Phone: [phone] Fax: [phone]      Your procedure is scheduled on Tuesday May 4  Report to [facility] Main Entrance "A" at 2:00 P.M., and check in at the Admitting office.  Call this number if you have problems the morning of surgery:  [phone]  Call [phone] if you have any questions prior to your surgery date Monday-Friday 8am-4pm    Remember:  Do not eat  after midnight the night before your surgery  You may drink clear liquids until 1:00 Pm  the afternoon of your surgery.   Clear liquids allowed are: Water, Non-Citrus Juices (without pulp), Carbonated Beverages, Clear Tea, Black Coffee Only, and Gatorade   .Please complete your PRE-SURGERY ENSURE that was provided to you by 1:00pm  the afternoon of surgery.  Please, if able, drink it in one setting. DO NOT SIP.   Take these medicines the morning of surgery with A SIP OF WATER  acetaminophen (TYLENOL) if needed amLODipine (NORVASC) atorvastatin (LIPITOR) linaclotide (LINZESS) omeprazole (PRILOSEC)   As of today, STOP taking any Aspirin (unless otherwise instructed by your surgeon) and Aspirin containing products, Aleve, Naproxen, Ibuprofen, Motrin, Advil, Goody's, BC's, all herbal medications, fish oil, and all vitamins.                      Do not wear jewelry, make up, or nail polish            Do not wear lotions, powders, perfumes/colognes, or deodorant.            Do not shave 48 hours prior to surgery.              Do not bring valuables to the hospital.            [facility] is not responsible for any belongings or valuables.  Do NOT Smoke (Tobacco/Vapping) or drink Alcohol 24 hours prior to your procedure If you use a CPAP at night, you may bring all equipment for your overnight stay.   Contacts, glasses, dentures or bridgework may not be worn into  surgery.      For patients admitted to the hospital, discharge time will be determined by your treatment team.   Patients discharged the day of surgery will not be allowed to drive home, and someone needs to stay with them for 24 hours.    Special instructions:   [place]- Preparing For Surgery  Before surgery, you can play an important role. Because skin is not sterile, your skin needs to be as free of germs as possible. You can reduce the number of germs on your skin by washing with CHG (chlorahexidine gluconate) Soap before surgery.  CHG is an antiseptic cleaner which kills germs and bonds with the skin to continue killing germs even after washing.    Oral Hygiene is also important to reduce your risk of infection.  Remember - BRUSH YOUR TEETH THE MORNING OF SURGERY WITH YOUR REGULAR TOOTHPASTE  Please do not use if you have an allergy to CHG or antibacterial soaps. If your skin becomes reddened/irritated stop using the CHG.  Do not shave (including legs and underarms) for at least 48 hours prior to first CHG shower. It is OK to shave your face.  Please follow these instructions carefully.   1. Shower the [employer] BEFORE SURGERY  and the MORNING OF SURGERY with CHG Soap.   2. If you chose to wash your hair, wash your hair first as usual with your normal shampoo.  3. After you shampoo, rinse your hair and body thoroughly to remove the shampoo.  4. Use CHG as you would any other liquid soap. You can apply CHG directly to the skin and wash gently with a scrungie or a clean washcloth.   5. Apply the CHG Soap to your body ONLY FROM THE NECK DOWN.  Do not use on open wounds or open sores. Avoid contact with your eyes, ears, mouth and genitals (private parts). Wash Face and genitals (private parts)  with your normal soap.   6. Wash thoroughly, paying special attention to the area where your surgery will be performed.  7. Thoroughly rinse your body with warm water from the neck  down.  8. DO NOT shower/wash with your normal soap after using and rinsing off the CHG Soap.  9. Pat yourself dry with a CLEAN TOWEL.  10. Wear CLEAN PAJAMAS to bed the night before surgery, wear comfortable clothes the morning of surgery  11. Place CLEAN SHEETS on your bed the night of your first shower and DO NOT SLEEP WITH PETS.   Day of Surgery:   Do not apply any deodorants/lotions.  Please wear clean clothes to the hospital/surgery center.   Remember to brush your teeth WITH YOUR REGULAR TOOTHPASTE.   Please read over the following fact sheets that you were given.

## 2019-07-13 ENCOUNTER — Encounter (HOSPITAL_COMMUNITY): Payer: Self-pay

## 2019-07-13 ENCOUNTER — Encounter (HOSPITAL_COMMUNITY)
Admission: RE | Admit: 2019-07-13 | Discharge: 2019-07-13 | Disposition: A | Discharge status: Home / Self Care | Payer: BC Managed Care – PPO | Source: Ambulatory Visit | Attending: General Surgery | Admitting: General Surgery

## 2019-07-13 ENCOUNTER — Other Ambulatory Visit: Payer: Self-pay

## 2019-07-13 ENCOUNTER — Other Ambulatory Visit (HOSPITAL_COMMUNITY)
Admission: RE | Admit: 2019-07-13 | Discharge: 2019-07-13 | Disposition: A | Discharge status: Home / Self Care | Payer: BC Managed Care – PPO | Source: Ambulatory Visit | Attending: General Surgery | Admitting: General Surgery

## 2019-07-13 DIAGNOSIS — Z01818 Encounter for other preprocedural examination: Secondary | ICD-10-CM | POA: Diagnosis not present

## 2019-07-13 DIAGNOSIS — Z20822 Contact with and (suspected) exposure to covid-19: Secondary | ICD-10-CM | POA: Insufficient documentation

## 2019-07-13 HISTORY — DX: Essential (primary) hypertension: I10

## 2019-07-13 HISTORY — DX: Hyperlipidemia, unspecified: E78.5

## 2019-07-13 HISTORY — DX: Gastro-esophageal reflux disease without esophagitis: K21.9

## 2019-07-13 LAB — BASIC METABOLIC PANEL
Anion gap: 9 (ref 5–15)
BUN: 9 mg/dL (ref 6–20)
CO2: 26 mmol/L (ref 22–32)
Calcium: 9.3 mg/dL (ref 8.9–10.3)
Chloride: 106 mmol/L (ref 98–111)
Creatinine, Ser: 0.87 mg/dL (ref 0.44–1.00)
GFR calc Af Amer: >60 mL/min (ref >60)
GFR calc non Af Amer: >60 mL/min (ref >60)
Glucose, Bld: 85 mg/dL (ref 70–99)
Potassium: 3.4 mmol/L — ABNORMAL LOW (ref 3.5–5.1)
Sodium: 141 mmol/L (ref 135–145)

## 2019-07-13 LAB — CBC
HCT: 33.3 % — ABNORMAL LOW (ref 36.0–46.0)
Hemoglobin: 10.2 g/dL — ABNORMAL LOW (ref 12.0–15.0)
MCH: 26.3 pg (ref 26.0–34.0)
MCHC: 30.6 g/dL (ref 30.0–36.0)
MCV: 85.8 fL (ref 80.0–100.0)
Platelets: 487 10*3/uL — ABNORMAL HIGH (ref 150–400)
RBC: 3.88 MIL/uL (ref 3.87–5.11)
RDW: 14.2 % (ref 11.5–15.5)
WBC: 11.9 10*3/uL — ABNORMAL HIGH (ref 4.0–10.5)
nRBC: 0 % (ref 0.0–0.2)

## 2019-07-13 LAB — SARS CORONAVIRUS 2 (TAT 6-24 HRS): SARS Coronavirus 2: NEGATIVE

## 2019-07-13 NOTE — Progress Notes (Signed)
Patient denies shortness of breath, fever, cough and chest pain.  PCP - Dr Greggory Stallion Osei-Bonsu Cardiologist - n/a  Chest x-ray - n/a EKG - 07/13/19 Stress Test - n/a ECHO - n/a Cardiac Cath - n/a  ERAS: Clears til 1 pm, Ensure drink given at PAT appt.  Anesthesia review: No  STOP now taking any Aspirin (unless otherwise instructed by your surgeon), Aleve, Naproxen, Ibuprofen, Motrin, Advil, Goody's, BC's, all herbal medications, fish oil, and all vitamins.   Coronavirus Screening Covid test scheduled 07/13/19 at 11:05 am Have you experienced the following symptoms:  Cough yes/no: No Fever (>100.29F)  yes/no: No Runny nose yes/no: No Sore throat yes/no: No Difficulty breathing/shortness of breath  yes/no: No  Have you traveled in the last 14 days and where? yes/no: No  Patient verbalized understanding of instructions that were given to them at the PAT appointment. Patient was also instructed that they will need to review over the PAT instructions again at home before surgery.

## 2019-07-17 ENCOUNTER — Encounter (HOSPITAL_COMMUNITY)
Admission: RE | Disposition: A | Discharge status: Home / Self Care | Payer: Self-pay | Source: Home / Self Care | Attending: General Surgery

## 2019-07-17 ENCOUNTER — Ambulatory Visit (HOSPITAL_COMMUNITY): Payer: BC Managed Care – PPO | Admitting: Anesthesiology

## 2019-07-17 ENCOUNTER — Encounter (HOSPITAL_COMMUNITY): Payer: Self-pay | Admitting: General Surgery

## 2019-07-17 ENCOUNTER — Ambulatory Visit (HOSPITAL_COMMUNITY)
Admission: RE | Admit: 2019-07-17 | Discharge: 2019-07-17 | Disposition: A | Discharge status: Home / Self Care | Payer: BC Managed Care – PPO | Attending: General Surgery | Admitting: General Surgery

## 2019-07-17 ENCOUNTER — Other Ambulatory Visit: Payer: Self-pay

## 2019-07-17 DIAGNOSIS — K219 Gastro-esophageal reflux disease without esophagitis: Secondary | ICD-10-CM | POA: Diagnosis not present

## 2019-07-17 DIAGNOSIS — Z87891 Personal history of nicotine dependence: Secondary | ICD-10-CM | POA: Diagnosis not present

## 2019-07-17 DIAGNOSIS — E785 Hyperlipidemia, unspecified: Secondary | ICD-10-CM | POA: Insufficient documentation

## 2019-07-17 DIAGNOSIS — I10 Essential (primary) hypertension: Secondary | ICD-10-CM | POA: Diagnosis not present

## 2019-07-17 DIAGNOSIS — Z79899 Other long term (current) drug therapy: Secondary | ICD-10-CM | POA: Diagnosis not present

## 2019-07-17 DIAGNOSIS — N808 Other endometriosis: Secondary | ICD-10-CM | POA: Diagnosis not present

## 2019-07-17 DIAGNOSIS — K432 Incisional hernia without obstruction or gangrene: Secondary | ICD-10-CM | POA: Diagnosis present

## 2019-07-17 HISTORY — PX: EXCISION MASS ABDOMINAL: SHX6701

## 2019-07-17 LAB — POCT PREGNANCY, URINE: Preg Test, Ur: NEGATIVE

## 2019-07-17 SURGERY — EXCISION, MASS, TORSO
Anesthesia: General | Site: Abdomen

## 2019-07-17 MED ORDER — PROPOFOL 10 MG/ML IV BOLUS
INTRAVENOUS | Status: AC
  Filled 2019-07-17: qty 20

## 2019-07-17 MED ORDER — ONDANSETRON HCL 4 MG/2ML IJ SOLN
INTRAMUSCULAR | Status: DC | PRN
  Administered 2019-07-17: 4 mg via INTRAVENOUS

## 2019-07-17 MED ORDER — CHLORHEXIDINE GLUCONATE CLOTH 2 % EX PADS: 6.0000 | MEDICATED_PAD | Freq: Once | CUTANEOUS | Status: DC

## 2019-07-17 MED ORDER — DEXAMETHASONE SODIUM PHOSPHATE 10 MG/ML IJ SOLN
INTRAMUSCULAR | Status: AC
  Filled 2019-07-17: qty 1

## 2019-07-17 MED ORDER — ONDANSETRON HCL 4 MG/2ML IJ SOLN
INTRAMUSCULAR | Status: AC
  Filled 2019-07-17: qty 2

## 2019-07-17 MED ORDER — BUPIVACAINE HCL (PF) 0.25 % IJ SOLN
INTRAMUSCULAR | Status: AC
  Filled 2019-07-17: qty 30

## 2019-07-17 MED ORDER — MIDAZOLAM HCL 2 MG/2ML IJ SOLN
INTRAMUSCULAR | Status: AC
  Filled 2019-07-17: qty 2

## 2019-07-17 MED ORDER — MIDAZOLAM HCL 5 MG/5ML IJ SOLN
INTRAMUSCULAR | Status: DC | PRN
  Administered 2019-07-17: 2 mg via INTRAVENOUS

## 2019-07-17 MED ORDER — MIDAZOLAM HCL 2 MG/2ML IJ SOLN: 0.5000 mg | Freq: Once | INTRAMUSCULAR | Status: DC | PRN

## 2019-07-17 MED ORDER — DEXAMETHASONE SODIUM PHOSPHATE 10 MG/ML IJ SOLN
INTRAMUSCULAR | Status: DC | PRN
  Administered 2019-07-17: 10 mg via INTRAVENOUS

## 2019-07-17 MED ORDER — MEPERIDINE HCL 25 MG/ML IJ SOLN: 6.2500 mg | INTRAMUSCULAR | Status: DC | PRN

## 2019-07-17 MED ORDER — HYDROMORPHONE HCL 1 MG/ML IJ SOLN
INTRAMUSCULAR | Status: AC
  Filled 2019-07-17: qty 1

## 2019-07-17 MED ORDER — ENSURE PRE-SURGERY PO LIQD: 296.0000 mL | Freq: Once | ORAL | Status: DC

## 2019-07-17 MED ORDER — HYDROMORPHONE HCL 1 MG/ML IJ SOLN
0.2500 mg | INTRAMUSCULAR | Status: DC | PRN
  Administered 2019-07-17 (×2): 0.25 mg via INTRAVENOUS

## 2019-07-17 MED ORDER — CELECOXIB 200 MG PO CAPS
ORAL_CAPSULE | ORAL | Status: AC
  Filled 2019-07-17: qty 1

## 2019-07-17 MED ORDER — CELECOXIB 200 MG PO CAPS
400.0000 mg | ORAL_CAPSULE | ORAL | Status: AC
  Administered 2019-07-17: 400 mg via ORAL
  Filled 2019-07-17: qty 2

## 2019-07-17 MED ORDER — GABAPENTIN 300 MG PO CAPS
300.0000 mg | ORAL_CAPSULE | ORAL | Status: AC
  Administered 2019-07-17: 300 mg via ORAL
  Filled 2019-07-17: qty 1

## 2019-07-17 MED ORDER — PROMETHAZINE HCL 25 MG/ML IJ SOLN: 6.2500 mg | INTRAMUSCULAR | Status: DC | PRN

## 2019-07-17 MED ORDER — SUGAMMADEX SODIUM 200 MG/2ML IV SOLN
INTRAVENOUS | Status: DC | PRN
  Administered 2019-07-17: 250 mg via INTRAVENOUS

## 2019-07-17 MED ORDER — 0.9 % SODIUM CHLORIDE (POUR BTL) OPTIME
TOPICAL | Status: DC | PRN
  Administered 2019-07-17: 1000 mL

## 2019-07-17 MED ORDER — FENTANYL CITRATE (PF) 250 MCG/5ML IJ SOLN
INTRAMUSCULAR | Status: DC | PRN
  Administered 2019-07-17: 50 ug via INTRAVENOUS
  Administered 2019-07-17: 100 ug via INTRAVENOUS

## 2019-07-17 MED ORDER — LIDOCAINE 2% (20 MG/ML) 5 ML SYRINGE
INTRAMUSCULAR | Status: DC | PRN
  Administered 2019-07-17: 40 mg via INTRAVENOUS

## 2019-07-17 MED ORDER — ACETAMINOPHEN 500 MG PO TABS
1000.0000 mg | ORAL_TABLET | ORAL | Status: AC
  Administered 2019-07-17: 500 mg via ORAL
  Filled 2019-07-17: qty 2

## 2019-07-17 MED ORDER — PROPOFOL 10 MG/ML IV BOLUS
INTRAVENOUS | Status: DC | PRN
  Administered 2019-07-17: 150 mg via INTRAVENOUS

## 2019-07-17 MED ORDER — OXYCODONE HCL 5 MG PO TABS
5.0000 mg | ORAL_TABLET | Freq: Four times a day (QID) | ORAL | 0 refills | Status: DC | PRN
Start: 2019-07-17 — End: 2020-04-30

## 2019-07-17 MED ORDER — ROCURONIUM BROMIDE 10 MG/ML (PF) SYRINGE
PREFILLED_SYRINGE | INTRAVENOUS | Status: DC | PRN
  Administered 2019-07-17: 50 mg via INTRAVENOUS

## 2019-07-17 MED ORDER — BUPIVACAINE HCL (PF) 0.25 % IJ SOLN
INTRAMUSCULAR | Status: DC | PRN
  Administered 2019-07-17: 20 mL

## 2019-07-17 MED ORDER — CEFAZOLIN SODIUM-DEXTROSE 2-4 GM/100ML-% IV SOLN
2.0000 g | INTRAVENOUS | Status: AC
  Administered 2019-07-17: 2 g via INTRAVENOUS
  Filled 2019-07-17: qty 100

## 2019-07-17 MED ORDER — LIDOCAINE 2% (20 MG/ML) 5 ML SYRINGE
INTRAMUSCULAR | Status: AC
  Filled 2019-07-17: qty 5

## 2019-07-17 MED ORDER — SCOPOLAMINE 1 MG/3DAYS TD PT72
1.0000 | MEDICATED_PATCH | Freq: Once | TRANSDERMAL | Status: DC
  Administered 2019-07-17: 1.5 mg via TRANSDERMAL
  Filled 2019-07-17: qty 1

## 2019-07-17 MED ORDER — FENTANYL CITRATE (PF) 250 MCG/5ML IJ SOLN
INTRAMUSCULAR | Status: AC
  Filled 2019-07-17: qty 5

## 2019-07-17 MED ORDER — LACTATED RINGERS IV SOLN: INTRAVENOUS | Status: DC

## 2019-07-17 SURGICAL SUPPLY — 33 items
BLADE CLIPPER SURG (BLADE) IMPLANT
CANISTER SUCT 3000ML PPV (MISCELLANEOUS) ×3 IMPLANT
CHLORAPREP W/TINT 26 (MISCELLANEOUS) ×3 IMPLANT
COVER SURGICAL LIGHT HANDLE (MISCELLANEOUS) ×3 IMPLANT
COVER WAND RF STERILE (DRAPES) ×3 IMPLANT
DERMABOND ADVANCED (GAUZE/BANDAGES/DRESSINGS) ×1
DERMABOND ADVANCED .7 DNX12 (GAUZE/BANDAGES/DRESSINGS) ×2 IMPLANT
DRAPE LAPAROTOMY 100X72 PEDS (DRAPES) ×3 IMPLANT
ELECT REM PT RETURN 9FT ADLT (ELECTROSURGICAL) ×3
ELECTRODE REM PT RTRN 9FT ADLT (ELECTROSURGICAL) ×2 IMPLANT
GLOVE BIO SURGEON STRL SZ8 (GLOVE) ×3 IMPLANT
GLOVE BIOGEL PI IND STRL 8 (GLOVE) ×2 IMPLANT
GLOVE BIOGEL PI INDICATOR 8 (GLOVE) ×1
GOWN STRL REUS W/ TWL LRG LVL3 (GOWN DISPOSABLE) ×2 IMPLANT
GOWN STRL REUS W/ TWL XL LVL3 (GOWN DISPOSABLE) ×2 IMPLANT
GOWN STRL REUS W/TWL LRG LVL3 (GOWN DISPOSABLE) ×3
GOWN STRL REUS W/TWL XL LVL3 (GOWN DISPOSABLE) ×3
KIT BASIN OR (CUSTOM PROCEDURE TRAY) ×3 IMPLANT
KIT TURNOVER KIT B (KITS) ×3 IMPLANT
NEEDLE 22X1 1/2 (OR ONLY) (NEEDLE) ×3 IMPLANT
NS IRRIG 1000ML POUR BTL (IV SOLUTION) ×3 IMPLANT
PACK GENERAL/GYN (CUSTOM PROCEDURE TRAY) ×3 IMPLANT
PAD ARMBOARD 7.5X6 YLW CONV (MISCELLANEOUS) ×3 IMPLANT
PENCIL SMOKE EVACUATOR (MISCELLANEOUS) ×3 IMPLANT
SUT MNCRL AB 4-0 PS2 18 (SUTURE) ×3 IMPLANT
SUT PROLENE 0 CT 1 30 (SUTURE) ×6 IMPLANT
SUT VIC AB 2-0 CT1 27 (SUTURE) ×3
SUT VIC AB 2-0 CT1 TAPERPNT 27 (SUTURE) ×2 IMPLANT
SUT VIC AB 3-0 SH 27 (SUTURE) ×3
SUT VIC AB 3-0 SH 27XBRD (SUTURE) ×2 IMPLANT
SYR CONTROL 10ML LL (SYRINGE) ×3 IMPLANT
TOWEL GREEN STERILE (TOWEL DISPOSABLE) ×3 IMPLANT
TOWEL GREEN STERILE FF (TOWEL DISPOSABLE) ×3 IMPLANT

## 2019-07-17 NOTE — Progress Notes (Signed)
Pt. Took tylenol 500 mg this am at 0830, Notified Dr. Janee Morn, stated to give the tylenol 500 mg instead of 1000 mg previously ordered.

## 2019-07-17 NOTE — Interval H&P Note (Signed)
History and Physical Interval Note:  07/17/2019 1:08 PM  Meghan Davila  has presented today for surgery, with the diagnosis of SUPRAUMBILICAL INCISIONAL HERNIA.  The various methods of treatment have been discussed with the patient and family. After consideration of risks, benefits and other options for treatment, the patient has consented to  Procedure(s): REPAIR SUPRA-UMBILICAL INCISIONAL HERNIA WITH MESH (N/A) as a surgical intervention.  The patient's history has been reviewed, patient examined, no change in status, stable for surgery.  I have reviewed the patient's chart and labs.  Questions were answered to the patient's satisfaction.     Liz Malady

## 2019-07-17 NOTE — Anesthesia Procedure Notes (Signed)
Procedure Name: Intubation Date/Time: 07/17/2019 1:54 PM Performed by: Shirlyn Goltz, CRNA Pre-anesthesia Checklist: Patient identified, Emergency Drugs available, Suction available and Patient being monitored Patient Re-evaluated:Patient Re-evaluated prior to induction Oxygen Delivery Method: Circle system utilized Preoxygenation: Pre-oxygenation with 100% oxygen Induction Type: IV induction Ventilation: Mask ventilation without difficulty Laryngoscope Size: Mac and 3 Grade View: Grade I Tube type: Oral Tube size: 7.0 mm Number of attempts: 1 Airway Equipment and Method: Stylet Placement Confirmation: ETT inserted through vocal cords under direct vision,  positive ETCO2 and breath sounds checked- equal and bilateral Secured at: 24 cm Tube secured with: Tape Dental Injury: Teeth and Oropharynx as per pre-operative assessment

## 2019-07-17 NOTE — Anesthesia Preprocedure Evaluation (Addendum)
Anesthesia Evaluation  Patient identified by MRN, date of birth, ID band Patient awake    Reviewed: Allergy & Precautions, NPO status , Patient's Chart, lab work & pertinent test results  History of Anesthesia Complications Negative for: history of anesthetic complications  Airway Mallampati: II  TM Distance: >3 FB Neck ROM: Full    Dental  (+) Dental Advisory Given, Chipped   Pulmonary former smoker,  07/13/2019 SARS coronavirus NEG   breath sounds clear to auscultation       Cardiovascular hypertension, Pt. on medications (-) angina Rhythm:Regular Rate:Normal     Neuro/Psych S/p VP shunt negative neurological ROS     GI/Hepatic Neg liver ROS, GERD  Medicated and Controlled,  Endo/Other  diabetes, Gestationalobese  Renal/GU negative Renal ROS     Musculoskeletal   Abdominal (+) + obese,   Peds  Hematology  (+) Blood dyscrasia (Hb 10.2), anemia ,   Anesthesia Other Findings   Reproductive/Obstetrics                            Anesthesia Physical Anesthesia Plan  ASA: II  Anesthesia Plan: General   Post-op Pain Management:    Induction: Intravenous  PONV Risk Score and Plan: 3 and Ondansetron, Dexamethasone and Scopolamine patch - Pre-op  Airway Management Planned: Oral ETT  Additional Equipment:   Intra-op Plan:   Post-operative Plan: Extubation in OR  Informed Consent: I have reviewed the patients History and Physical, chart, labs and discussed the procedure including the risks, benefits and alternatives for the proposed anesthesia with the patient or authorized representative who has indicated his/her understanding and acceptance.     Dental advisory given  Plan Discussed with: CRNA and Surgeon  Anesthesia Plan Comments:        Anesthesia Quick Evaluation

## 2019-07-17 NOTE — Transfer of Care (Signed)
Immediate Anesthesia Transfer of Care Note  Patient: Meghan Davila  Procedure(s) Performed: EXCISION OF SUPRAUMBILICAL MASS (N/A Abdomen)  Patient Location: PACU  Anesthesia Type:General  Level of Consciousness: awake, oriented and patient cooperative  Airway & Oxygen Therapy: Patient Spontanous Breathing and Patient connected to nasal cannula oxygen  Post-op Assessment: Report given to RN and Post -op Vital signs reviewed and stable  Post vital signs: Reviewed  Last Vitals:  Vitals Value Taken Time  BP 154/89 07/17/19 1445  Temp 36.1 C 07/17/19 1445  Pulse 87 07/17/19 1450  Resp 16 07/17/19 1450  SpO2 100 % 07/17/19 1450  Vitals shown include unvalidated device data.  Last Pain:  Vitals:   07/17/19 1445  TempSrc:   PainSc: Asleep         Complications: No apparent anesthesia complications

## 2019-07-17 NOTE — Anesthesia Postprocedure Evaluation (Signed)
Anesthesia Post Note  Patient: Meghan Davila  Procedure(s) Performed: EXCISION OF SUPRAUMBILICAL MASS (N/A Abdomen)     Patient location during evaluation: PACU Anesthesia Type: General Level of consciousness: awake and alert, oriented and patient cooperative Pain management: pain level controlled Vital Signs Assessment: post-procedure vital signs reviewed and stable Respiratory status: spontaneous breathing, nonlabored ventilation and respiratory function stable Cardiovascular status: blood pressure returned to baseline and stable Postop Assessment: no apparent nausea or vomiting Anesthetic complications: no    Last Vitals:  Vitals:   07/17/19 1515 07/17/19 1530  BP: (!) 158/80 138/78  Pulse: 83 82  Resp: 16 16  Temp:  36.7 C  SpO2: 100% 100%    Last Pain:  Vitals:   07/17/19 1515  TempSrc:   PainSc: Asleep                 [name],E. Yohan Samons

## 2019-07-17 NOTE — Op Note (Signed)
  07/17/2019  2:36 PM  PATIENT:  Meghan Davila  44 y.o. female  PRE-OPERATIVE DIAGNOSIS:  SUPRAUMBILICAL INCISIONAL HERNIA  POST-OPERATIVE DIAGNOSIS:  SUPRAUMBILICAL MASS  PROCEDURE:  Procedure(s): EXCISION OF SUPRAUMBILICAL MASS 4CM WITH LAYERED CLOSURE  SURGEON:  Surgeon(s): Violeta Gelinas, MD  ASSISTANTS: none   ANESTHESIA:   local and general  EBL:  No intake/output data recorded.  BLOOD ADMINISTERED:none  DRAINS: none   SPECIMEN:  Excision  DISPOSITION OF SPECIMEN:  PATHOLOGY  COUNTS:  YES  DICTATION: .Dragon Dictation Findings: No hernia.  4 cm supraumbilical soft tissue mass above the fascia.  Procedure in detail: Informed consent was obtained.  She received intravenous antibiotics.  She was brought to the operating room and general endotracheal anesthesia was administered by the anesthesia staff.  Her abdomen was prepped and draped in a sterile fashion.  We did a timeout procedure.  The supraumbilical region was infiltrated with local.  A vertical incision was made over the palpable mass.  Subcutaneous tissues were dissected down revealing a 4 cm mass sitting on top of the fascia.  There was no hernia.  The mass was circumferentially dissected using cautery.  It was excised and sent to pathology.  It was very firm.  The fascia remained intact.  Additional local anesthetic was injected.  The area was irrigated and hemostasis was ensured.  The incision was closed in layers with deep tissues approximated with interrupted 2-0 Vicryl suture.  The skin was closed with running 4-0 Vicryl subcuticular followed by Dermabond.  All counts were correct.  She tolerated the procedure well without apparent complication & was taken recovery in stable condition. PATIENT DISPOSITION:  PACU - hemodynamically stable.   Delay start of Pharmacological VTE agent (>24hrs) due to surgical blood loss or risk of bleeding:  no  Violeta Gelinas, MD, MPH, FACS Pager: [phone]  5/4/20212:36  PM

## 2019-07-17 NOTE — H&P (Signed)
Meghan Davila is an 44 y.o. female.   Chief Complaint: Supraumbilical incisional hernia HPI: Patient presents for repair of supraumbilical incisional hernia with mesh.  Symptoms have been stable since I saw her in the office.  Past Medical History:  Diagnosis Date  . GERD (gastroesophageal reflux disease)   . Gestational diabetes    only with pregnancy   . HLD (hyperlipidemia)   . HTN (hypertension)   . PIH (pregnancy induced hypertension)     Past Surgical History:  Procedure Laterality Date  . BRAIN SURGERY  2001   shunt put in, no current problems per pt as of 07/13/19  . BREAST REDUCTION SURGERY  2008  . BREAST SURGERY Bilateral 2008   reduction  . CESAREAN SECTION  10/22/09  . CESAREAN SECTION  2002  . CHOLECYSTECTOMY  2004  . COLONOSCOPY    . LAPAROSCOPIC TUBAL LIGATION Bilateral 10/24/2018   Procedure: LAPAROSCOPIC TUBAL LIGATION By Salpingectomy;  Surgeon: Shea Evans, MD;  Location: [facility];  Service: Gynecology;  Laterality: Bilateral;  . TUBAL LIGATION    . UPPER GI ENDOSCOPY      Family History  Problem Relation Age of Onset  . Hypertension Mother   . Hypertension Sister   . Hypertension Maternal Grandmother   . Cancer Maternal Grandmother   . Hypertension Maternal Grandfather   . Diabetes Maternal Grandfather    Social History:  reports that she quit smoking about 19 months ago. Her smoking use included cigarettes. She quit after 8.00 years of use. She has never used smokeless tobacco. She reports that she does not drink alcohol or use drugs.  Allergies: No Known Allergies  Medications Prior to Admission  Medication Sig Dispense Refill  . acetaminophen (TYLENOL) 500 MG tablet Take 500-1,000 mg by mouth every 6 (six) hours as needed for moderate pain or headache.    Marland Kitchen amLODipine (NORVASC) 10 MG tablet Take 1 tablet (10 mg total) by mouth daily. 30 tablet 0  . atorvastatin (LIPITOR) 10 MG tablet Take 10 mg by mouth daily.    Marland Kitchen  losartan-hydrochlorothiazide (HYZAAR) 100-25 MG tablet Take 1 tablet by mouth daily.    Marland Kitchen omeprazole (PRILOSEC) 40 MG capsule Take 40 mg by mouth daily.    . hydrochlorothiazide (HYDRODIURIL) 25 MG tablet Take 1 tablet (25 mg total) by mouth daily. (Patient not taking: Reported on 07/05/2019) 30 tablet 0  . labetalol (NORMODYNE) 100 MG tablet Take 1 tablet (100 mg total) by mouth 2 (two) times daily. (Patient not taking: Reported on 07/05/2019) 60 tablet 0  . linaclotide (LINZESS) 290 MCG CAPS capsule Take 290 mcg by mouth daily as needed (constipation).    Marland Kitchen losartan (COZAAR) 50 MG tablet Take 1 tablet (50 mg total) by mouth daily. (Patient not taking: Reported on 07/05/2019) 30 tablet 0    Results for orders placed or performed during the hospital encounter of 07/17/19 (from the past 48 hour(s))  Pregnancy, urine POC     Status: None   Collection Time: 07/17/19 11:29 AM  Result Value Ref Range   Preg Test, Ur NEGATIVE NEGATIVE    Comment:        THE SENSITIVITY OF THIS METHODOLOGY IS >24 mIU/mL    No results found.  Review of Systems  Blood pressure (!) 152/83, pulse 73, temperature 98.3 F (36.8 C), temperature source Oral, resp. rate 17, height 5' 1.5" (1.562 m), weight 80.3 kg, last menstrual period 06/29/2019, SpO2 100 %. Physical Exam  Constitutional: She is oriented  to person, place, and time. She appears well-developed and well-nourished.  HENT:  Head: Normocephalic.  Eyes: Pupils are equal, round, and reactive to light. EOM are normal.  Cardiovascular: Normal rate and regular rhythm.  Respiratory: Effort normal and breath sounds normal.  GI: Soft.  Supraumbilical incisional hernia partly reduces, no tenderness  Musculoskeletal:        General: No edema. Normal range of motion.     Cervical back: Neck supple.  Neurological: She is alert and oriented to person, place, and time.  Skin: Skin is warm and dry.  Psychiatric: She has a normal mood and affect.      Assessment/Plan Supraumbilical incisional hernia  -plan repair of supraumbilical incisional hernia with mesh.  I again discussed the procedure, risks, and benefits with her.  I discussed the expected postoperative course.  She is agreeable.  Zenovia Jarred, MD 07/17/2019, 1:07 PM

## 2019-07-19 LAB — SURGICAL PATHOLOGY

## 2019-11-15 ENCOUNTER — Ambulatory Visit: Payer: BC Managed Care – PPO | Attending: Family

## 2019-11-15 DIAGNOSIS — Z23 Encounter for immunization: Secondary | ICD-10-CM

## 2019-11-23 NOTE — Progress Notes (Signed)
   Covid-19 Vaccination Clinic  Name:  Meghan Davila    MRN: [id number] DOB: 06-16-1975  11/23/2019  [name] was observed post Covid-19 immunization for 15 minutes without incident. She was provided with Vaccine Information Sheet and instruction to access the V-Safe system.   [name] was instructed to call 911 with any severe reactions post vaccine: Marland Kitchen Difficulty breathing  . Swelling of face and throat  . A fast heartbeat  . A bad rash all over body  . Dizziness and weakness   Immunizations Administered    Name Date Dose VIS Date Route   Pfizer COVID-19 Vaccine 11/15/2019 12:00 PM 0.3 mL 05/09/2018 Intramuscular   Manufacturer: [employer], [employer]   Lot: [id number]   NDC: [address]-3

## 2019-12-06 ENCOUNTER — Ambulatory Visit: Payer: BC Managed Care – PPO | Attending: Family

## 2019-12-06 DIAGNOSIS — Z23 Encounter for immunization: Secondary | ICD-10-CM

## 2019-12-27 NOTE — Progress Notes (Signed)
   Covid-19 Vaccination Clinic  Name:  HANIN DECOOK    MRN: [id number] DOB: Dec 21, 1975  12/27/2019  Ms. Hsia was observed post Covid-19 immunization for 15 minutes without incident. She was provided with Vaccine Information Sheet and instruction to access the V-Safe system.   Ms. Bilek was instructed to call 911 with any severe reactions post vaccine: Marland Kitchen Difficulty breathing  . Swelling of face and throat  . A fast heartbeat  . A bad rash all over body  . Dizziness and weakness   Immunizations Administered    Name Date Dose VIS Date Route   Pfizer COVID-19 Vaccine 12/06/2019  8:45 AM 0.3 mL 05/09/2018 Intramuscular   Manufacturer: [employer], [employer]   Lot: [id number]   NDC: [address]-3

## 2020-03-18 ENCOUNTER — Emergency Department (HOSPITAL_COMMUNITY)
Admission: EM | Admit: 2020-03-18 | Discharge: 2020-03-18 | Disposition: A | Discharge status: Home / Self Care | Payer: BC Managed Care – PPO | Attending: Emergency Medicine | Admitting: Emergency Medicine

## 2020-03-18 ENCOUNTER — Encounter (HOSPITAL_COMMUNITY): Payer: Self-pay

## 2020-03-18 ENCOUNTER — Other Ambulatory Visit: Payer: Self-pay

## 2020-03-18 DIAGNOSIS — Z5321 Procedure and treatment not carried out due to patient leaving prior to being seen by health care provider: Secondary | ICD-10-CM | POA: Insufficient documentation

## 2020-03-18 DIAGNOSIS — L0291 Cutaneous abscess, unspecified: Secondary | ICD-10-CM | POA: Insufficient documentation

## 2020-03-18 LAB — CBC
HCT: 34.9 % — ABNORMAL LOW (ref 36.0–46.0)
Hemoglobin: 10.9 g/dL — ABNORMAL LOW (ref 12.0–15.0)
MCH: 27.3 pg (ref 26.0–34.0)
MCHC: 31.2 g/dL (ref 30.0–36.0)
MCV: 87.5 fL (ref 80.0–100.0)
Platelets: 390 10*3/uL (ref 150–400)
RBC: 3.99 MIL/uL (ref 3.87–5.11)
RDW: 15.1 % (ref 11.5–15.5)
WBC: 11.5 10*3/uL — ABNORMAL HIGH (ref 4.0–10.5)
nRBC: 0 % (ref 0.0–0.2)

## 2020-03-18 LAB — BASIC METABOLIC PANEL
Anion gap: 10 (ref 5–15)
BUN: 11 mg/dL (ref 6–20)
CO2: 19 mmol/L — ABNORMAL LOW (ref 22–32)
Calcium: 8.9 mg/dL (ref 8.9–10.3)
Chloride: 109 mmol/L (ref 98–111)
Creatinine, Ser: 0.81 mg/dL (ref 0.44–1.00)
GFR, Estimated: >60 mL/min (ref >60)
Glucose, Bld: 99 mg/dL (ref 70–99)
Potassium: 3.8 mmol/L (ref 3.5–5.1)
Sodium: 138 mmol/L (ref 135–145)

## 2020-03-18 LAB — I-STAT BETA HCG BLOOD, ED (MC, WL, AP ONLY): I-stat hCG, quantitative: <5 m[IU]/mL (ref <5)

## 2020-03-18 NOTE — ED Triage Notes (Signed)
Pt presents POV with "knots around her navel" foe several months. Pt reports surgery for endometriosis in the past for the same pain. Pt is concerned it has returned. Pt here today due to a long menstrual cycle and would like a work up for possible endometriosis

## 2020-03-18 NOTE — ED Notes (Signed)
Patient approached this tech and said she was going to leave. This tech recommended that patient stay to be seen by a doctor. Patient refused and said she would go home and call her PCP in the morning. Patient left.

## 2020-04-02 ENCOUNTER — Other Ambulatory Visit: Payer: Self-pay | Admitting: General Surgery

## 2020-04-02 DIAGNOSIS — R1033 Periumbilical pain: Secondary | ICD-10-CM

## 2020-04-16 ENCOUNTER — Ambulatory Visit
Admission: RE | Admit: 2020-04-16 | Discharge: 2020-04-16 | Disposition: A | Discharge status: Home / Self Care | Payer: Self-pay | Source: Ambulatory Visit | Attending: General Surgery | Admitting: General Surgery

## 2020-04-16 ENCOUNTER — Other Ambulatory Visit: Payer: Self-pay

## 2020-04-16 DIAGNOSIS — R1033 Periumbilical pain: Secondary | ICD-10-CM

## 2020-04-16 MED ORDER — IOPAMIDOL (ISOVUE-300) INJECTION 61%
100.0000 mL | Freq: Once | INTRAVENOUS | Status: AC | PRN
  Administered 2020-04-16: 100 mL via INTRAVENOUS

## 2020-04-30 ENCOUNTER — Ambulatory Visit: Payer: Self-pay | Admitting: General Surgery

## 2020-04-30 NOTE — H&P (View-Only) (Signed)
  Meghan Davila Appointment: 04/30/2020 11:40 AM Location: [facility] Patient #: [id number] DOB: [birth year] Undefined / Language: [place] / Race: Black or African American Female  History of Present Illness Meghan Josephs E. Janee Morn MD; 04/30/2020 12:00 PM) The patient is a 45 year old female presenting to discuss diagnostic procedure results. Patient is status post excision of supraumbilical endometrioma. She developed what appeared to be an incisional hernia as well as recurrent endometrial tissue. I sent her for CT scan which confirmed this. I spoke with her by telephone and she returns for further discussion of possible surgery. In an bulging at the hernia site. That seems to be bothering her more than the endometrial tissue at her umbilicus.    Physical Exam Meghan Josephs E. Janee Morn MD; 04/30/2020 12:01 PM)  General Mental Status-Alert. General Appearance-Consistent with stated age. Hydration-Well hydrated. Voice-Normal.  Head and Neck Head-normocephalic, atraumatic with no lesions or palpable masses. Trachea-midline. Thyroid Gland Characteristics - normal size and consistency.  Eye Eyeball - Bilateral-Extraocular movements intact. Sclera/Conjunctiva - Bilateral-No scleral icterus.  Chest and Lung Exam Chest and lung exam reveals -quiet, even and easy respiratory effort with no use of accessory muscles and on auscultation, normal breath sounds, no adventitious sounds and normal vocal resonance. Inspection Chest Wall - Normal. Back - normal.  Cardiovascular Cardiovascular examination reveals -normal heart sounds, regular rate and rhythm with no murmurs and normal pedal pulses bilaterally.  Abdomen Inspection Hernias - Incisional - Reducible. Note: Incisional hernia above umbilicus, reduces with some discomfort. Incisional scars - Laparotomy scar. Palpation/Percussion Palpation and Percussion of the abdomen reveal - Soft, Non Tender, No Rebound  tenderness, No Rigidity (guarding) and No hepatosplenomegaly. Auscultation Auscultation of the abdomen reveals - Bowel sounds normal.  Neurologic Neurologic evaluation reveals -alert and oriented x 3 with no impairment of recent or remote memory. Mental Status-Normal.  Musculoskeletal Global Assessment -Note:no gross deformities.  Normal Exam - Left-Upper Extremity Strength Normal and Lower Extremity Strength Normal. Normal Exam - Right-Upper Extremity Strength Normal and Lower Extremity Strength Normal.  Lymphatic Head & Neck  General Head & Neck Lymphatics: Bilateral - Description - Normal. Axillary  General Axillary Region: Bilateral - Description - Normal. Tenderness - Non Tender. Femoral & Inguinal  Generalized Femoral & Inguinal Lymphatics: Bilateral - Description - No Generalized lymphadenopathy.    Assessment & Plan Meghan Josephs E. Janee Morn MD; 04/30/2020 12:04 PM)  ENDOMETRIOMA (N80.9) Impression: I will plan to excise this as able during her hernia surgery. She does not want her umbilicus removed which I told her would be the best way to ensure it is gone.   INCISIONAL HERNIA, WITHOUT OBSTRUCTION OR GANGRENE (K43.2) Impression: Plan incisional hernia repair with mesh as an outpatient surgical procedure. I discussed the procedure, risks, benefits, and expected postoperative course. I discussed our ERAS protocol. I will give her tramadol when necessary for pain in the interim and look to schedule urgently.  Current Plans Started traMADol HCl 50 MG Oral Tablet, 1 (one) Tablet 3 times a day as needed, #30, 04/30/2020, No Refill.

## 2020-04-30 NOTE — H&P (Signed)
  Meghan Davila Appointment: 04/30/2020 11:40 AM Location: [facility] Patient #: [id number] DOB: 04/17/1975 Undefined / Language: English / Race: Black or African American Female  History of Present Illness (Meghan Rattan E. [name] MD; 04/30/2020 12:00 PM) The patient is a 45 year old female presenting to discuss diagnostic procedure results. Patient is status post excision of supraumbilical endometrioma. She developed what appeared to be an incisional hernia as well as recurrent endometrial tissue. I sent her for CT scan which confirmed this. I spoke with her by telephone and she returns for further discussion of possible surgery. In an bulging at the hernia site. That seems to be bothering her more than the endometrial tissue at her umbilicus.    Physical Exam (Meghan [name] MD; 04/30/2020 12:01 PM)  General Mental Status-Alert. General Appearance-Consistent with stated age. Hydration-Well hydrated. Voice-Normal.  Head and Neck Head-normocephalic, atraumatic with no lesions or palpable masses. Trachea-midline. Thyroid Gland Characteristics - normal size and consistency.  Eye Eyeball - Bilateral-Extraocular movements intact. Sclera/Conjunctiva - Bilateral-No scleral icterus.  Chest and Lung Exam Chest and lung exam reveals -quiet, even and easy respiratory effort with no use of accessory muscles and on auscultation, normal breath sounds, no adventitious sounds and normal vocal resonance. Inspection Chest Wall - Normal. Back - normal.  Cardiovascular Cardiovascular examination reveals -normal heart sounds, regular rate and rhythm with no murmurs and normal pedal pulses bilaterally.  Abdomen Inspection Hernias - Incisional - Reducible. Note: Incisional hernia above umbilicus, reduces with some discomfort. Incisional scars - Laparotomy scar. Palpation/Percussion Palpation and Percussion of the abdomen reveal - Soft, Non Tender, No Rebound  tenderness, No Rigidity (guarding) and No hepatosplenomegaly. Auscultation Auscultation of the abdomen reveals - Bowel sounds normal.  Neurologic Neurologic evaluation reveals -alert and oriented x 3 with no impairment of recent or remote memory. Mental Status-Normal.  Musculoskeletal Global Assessment -Note:no gross deformities.  Normal Exam - Left-Upper Extremity Strength Normal and Lower Extremity Strength Normal. Normal Exam - Right-Upper Extremity Strength Normal and Lower Extremity Strength Normal.  Lymphatic Head & Neck  General Head & Neck Lymphatics: Bilateral - Description - Normal. Axillary  General Axillary Region: Bilateral - Description - Normal. Tenderness - Non Tender. Femoral & Inguinal  Generalized Femoral & Inguinal Lymphatics: Bilateral - Description - No Generalized lymphadenopathy.    Assessment & Plan (Meghan Imran E. [name] MD; 04/30/2020 12:04 PM)  ENDOMETRIOMA (N80.9) Impression: I will plan to excise this as able during her hernia surgery. She does not want her umbilicus removed which I told her would be the best way to ensure it is gone.   INCISIONAL HERNIA, WITHOUT OBSTRUCTION OR GANGRENE (K43.2) Impression: Plan incisional hernia repair with mesh as an outpatient surgical procedure. I discussed the procedure, risks, benefits, and expected postoperative course. I discussed our ERAS protocol. I will give her tramadol when necessary for pain in the interim and look to schedule urgently.  Current Plans Started traMADol HCl 50 MG Oral Tablet, 1 (one) Tablet 3 times a day as needed, #30, 04/30/2020, No Refill. 

## 2020-05-08 ENCOUNTER — Other Ambulatory Visit (HOSPITAL_COMMUNITY)
Admission: RE | Admit: 2020-05-08 | Discharge: 2020-05-08 | Disposition: A | Discharge status: Home / Self Care | Payer: BC Managed Care – PPO | Source: Ambulatory Visit | Attending: General Surgery | Admitting: General Surgery

## 2020-05-08 DIAGNOSIS — Z01812 Encounter for preprocedural laboratory examination: Secondary | ICD-10-CM | POA: Insufficient documentation

## 2020-05-08 DIAGNOSIS — Z20822 Contact with and (suspected) exposure to covid-19: Secondary | ICD-10-CM | POA: Insufficient documentation

## 2020-05-08 LAB — SARS CORONAVIRUS 2 (TAT 6-24 HRS): SARS Coronavirus 2: NEGATIVE

## 2020-05-09 ENCOUNTER — Encounter (HOSPITAL_COMMUNITY): Payer: Self-pay | Admitting: General Surgery

## 2020-05-09 NOTE — Progress Notes (Signed)
Spoke with pt for pre-op call. Pt denies cardiac history, is treated for HTN.   Covid test done  05/08/20 and it's negative. Pt states she's been in quarantine since the test was done and understands that she stays in quarantine until he comes to the hospital on Monday.

## 2020-05-12 ENCOUNTER — Ambulatory Visit (HOSPITAL_COMMUNITY): Payer: BC Managed Care – PPO | Admitting: Certified Registered Nurse Anesthetist

## 2020-05-12 ENCOUNTER — Other Ambulatory Visit: Payer: Self-pay

## 2020-05-12 ENCOUNTER — Encounter (HOSPITAL_COMMUNITY)
Admission: RE | Disposition: A | Discharge status: Home / Self Care | Payer: Self-pay | Source: Home / Self Care | Attending: General Surgery

## 2020-05-12 ENCOUNTER — Encounter (HOSPITAL_COMMUNITY): Payer: Self-pay | Admitting: General Surgery

## 2020-05-12 ENCOUNTER — Ambulatory Visit (HOSPITAL_COMMUNITY)
Admission: RE | Admit: 2020-05-12 | Discharge: 2020-05-12 | Disposition: A | Discharge status: Home / Self Care | Payer: BC Managed Care – PPO | Attending: General Surgery | Admitting: General Surgery

## 2020-05-12 DIAGNOSIS — K432 Incisional hernia without obstruction or gangrene: Secondary | ICD-10-CM | POA: Insufficient documentation

## 2020-05-12 HISTORY — PX: INCISIONAL HERNIA REPAIR: SHX193

## 2020-05-12 HISTORY — DX: Personal history of other diseases of the digestive system: Z87.19

## 2020-05-12 LAB — BASIC METABOLIC PANEL
Anion gap: 12 (ref 5–15)
BUN: 13 mg/dL (ref 6–20)
CO2: 22 mmol/L (ref 22–32)
Calcium: 9.7 mg/dL (ref 8.9–10.3)
Chloride: 103 mmol/L (ref 98–111)
Creatinine, Ser: 0.77 mg/dL (ref 0.44–1.00)
GFR, Estimated: >60 mL/min (ref >60)
Glucose, Bld: 87 mg/dL (ref 70–99)
Potassium: 3.5 mmol/L (ref 3.5–5.1)
Sodium: 137 mmol/L (ref 135–145)

## 2020-05-12 LAB — CBC
HCT: 39 % (ref 36.0–46.0)
Hemoglobin: 12.6 g/dL (ref 12.0–15.0)
MCH: 27.5 pg (ref 26.0–34.0)
MCHC: 32.3 g/dL (ref 30.0–36.0)
MCV: 85.2 fL (ref 80.0–100.0)
Platelets: 479 10*3/uL — ABNORMAL HIGH (ref 150–400)
RBC: 4.58 MIL/uL (ref 3.87–5.11)
RDW: 14.2 % (ref 11.5–15.5)
WBC: 11.8 10*3/uL — ABNORMAL HIGH (ref 4.0–10.5)
nRBC: 0 % (ref 0.0–0.2)

## 2020-05-12 LAB — POCT PREGNANCY, URINE: Preg Test, Ur: NEGATIVE

## 2020-05-12 SURGERY — REPAIR, HERNIA, INCISIONAL
Anesthesia: General | Site: Abdomen

## 2020-05-12 MED ORDER — SUGAMMADEX SODIUM 200 MG/2ML IV SOLN
INTRAVENOUS | Status: DC | PRN
  Administered 2020-05-12: 300 mg via INTRAVENOUS

## 2020-05-12 MED ORDER — ROCURONIUM BROMIDE 10 MG/ML (PF) SYRINGE
PREFILLED_SYRINGE | INTRAVENOUS | Status: AC
  Filled 2020-05-12: qty 10

## 2020-05-12 MED ORDER — HYDROMORPHONE HCL 1 MG/ML IJ SOLN
0.2500 mg | INTRAMUSCULAR | Status: DC | PRN
  Administered 2020-05-12 (×2): 0.25 mg via INTRAVENOUS

## 2020-05-12 MED ORDER — HYDROMORPHONE HCL 1 MG/ML IJ SOLN
INTRAMUSCULAR | Status: AC
  Filled 2020-05-12: qty 1

## 2020-05-12 MED ORDER — CHLORHEXIDINE GLUCONATE CLOTH 2 % EX PADS: 6.0000 | MEDICATED_PAD | Freq: Once | CUTANEOUS | Status: DC

## 2020-05-12 MED ORDER — BUPIVACAINE HCL (PF) 0.25 % IJ SOLN
INTRAMUSCULAR | Status: AC
  Filled 2020-05-12: qty 30

## 2020-05-12 MED ORDER — PROMETHAZINE HCL 25 MG/ML IJ SOLN: 6.2500 mg | INTRAMUSCULAR | Status: DC | PRN

## 2020-05-12 MED ORDER — OXYCODONE HCL 5 MG PO TABS: 5.0000 mg | ORAL_TABLET | Freq: Four times a day (QID) | ORAL | 0 refills | Status: DC | PRN

## 2020-05-12 MED ORDER — LACTATED RINGERS IV SOLN: INTRAVENOUS | Status: DC

## 2020-05-12 MED ORDER — DEXAMETHASONE SODIUM PHOSPHATE 10 MG/ML IJ SOLN
INTRAMUSCULAR | Status: AC
  Filled 2020-05-12: qty 1

## 2020-05-12 MED ORDER — ACETAMINOPHEN 500 MG PO TABS
1000.0000 mg | ORAL_TABLET | ORAL | Status: AC
  Administered 2020-05-12: 1000 mg via ORAL
  Filled 2020-05-12: qty 2

## 2020-05-12 MED ORDER — DEXAMETHASONE SODIUM PHOSPHATE 10 MG/ML IJ SOLN
INTRAMUSCULAR | Status: DC | PRN
  Administered 2020-05-12: 10 mg via INTRAVENOUS

## 2020-05-12 MED ORDER — LIDOCAINE 2% (20 MG/ML) 5 ML SYRINGE
INTRAMUSCULAR | Status: DC | PRN
  Administered 2020-05-12: 20 mg via INTRAVENOUS
  Administered 2020-05-12: 40 mg via INTRAVENOUS

## 2020-05-12 MED ORDER — OXYCODONE HCL 5 MG PO TABS: 5.0000 mg | ORAL_TABLET | Freq: Once | ORAL | Status: DC | PRN

## 2020-05-12 MED ORDER — ORAL CARE MOUTH RINSE: 15.0000 mL | Freq: Once | OROMUCOSAL | Status: AC

## 2020-05-12 MED ORDER — MEPERIDINE HCL 25 MG/ML IJ SOLN: 6.2500 mg | INTRAMUSCULAR | Status: DC | PRN

## 2020-05-12 MED ORDER — DEXMEDETOMIDINE (PRECEDEX) IN NS 20 MCG/5ML (4 MCG/ML) IV SYRINGE
PREFILLED_SYRINGE | INTRAVENOUS | Status: AC
  Filled 2020-05-12: qty 5

## 2020-05-12 MED ORDER — CELECOXIB 200 MG PO CAPS
400.0000 mg | ORAL_CAPSULE | ORAL | Status: AC
  Administered 2020-05-12: 400 mg via ORAL
  Filled 2020-05-12: qty 2

## 2020-05-12 MED ORDER — ONDANSETRON HCL 4 MG/2ML IJ SOLN
INTRAMUSCULAR | Status: DC | PRN
  Administered 2020-05-12: 4 mg via INTRAVENOUS

## 2020-05-12 MED ORDER — ONDANSETRON HCL 4 MG/2ML IJ SOLN
INTRAMUSCULAR | Status: AC
  Filled 2020-05-12: qty 2

## 2020-05-12 MED ORDER — PROPOFOL 10 MG/ML IV BOLUS
INTRAVENOUS | Status: DC | PRN
  Administered 2020-05-12: 20 mg via INTRAVENOUS
  Administered 2020-05-12: 40 mg via INTRAVENOUS
  Administered 2020-05-12: 120 mg via INTRAVENOUS

## 2020-05-12 MED ORDER — OXYCODONE HCL 5 MG/5ML PO SOLN: 5.0000 mg | Freq: Once | ORAL | Status: DC | PRN

## 2020-05-12 MED ORDER — BUPIVACAINE-EPINEPHRINE 0.25% -1:200000 IJ SOLN
INTRAMUSCULAR | Status: DC | PRN
  Administered 2020-05-12: 20 mL

## 2020-05-12 MED ORDER — FENTANYL CITRATE (PF) 250 MCG/5ML IJ SOLN
INTRAMUSCULAR | Status: AC
  Filled 2020-05-12: qty 5

## 2020-05-12 MED ORDER — SCOPOLAMINE 1 MG/3DAYS TD PT72
1.0000 | MEDICATED_PATCH | TRANSDERMAL | Status: DC
  Administered 2020-05-12: 1.5 mg via TRANSDERMAL
  Filled 2020-05-12: qty 1

## 2020-05-12 MED ORDER — DEXMEDETOMIDINE (PRECEDEX) IN NS 20 MCG/5ML (4 MCG/ML) IV SYRINGE
PREFILLED_SYRINGE | INTRAVENOUS | Status: DC | PRN
  Administered 2020-05-12: 16 ug via INTRAVENOUS

## 2020-05-12 MED ORDER — PHENYLEPHRINE 40 MCG/ML (10ML) SYRINGE FOR IV PUSH (FOR BLOOD PRESSURE SUPPORT)
PREFILLED_SYRINGE | INTRAVENOUS | Status: AC
  Filled 2020-05-12: qty 10

## 2020-05-12 MED ORDER — ROCURONIUM BROMIDE 100 MG/10ML IV SOLN
INTRAVENOUS | Status: DC | PRN
  Administered 2020-05-12: 70 mg via INTRAVENOUS

## 2020-05-12 MED ORDER — 0.9 % SODIUM CHLORIDE (POUR BTL) OPTIME
TOPICAL | Status: DC | PRN
  Administered 2020-05-12: 1000 mL

## 2020-05-12 MED ORDER — ENSURE PRE-SURGERY PO LIQD: 296.0000 mL | Freq: Once | ORAL | Status: DC

## 2020-05-12 MED ORDER — MIDAZOLAM HCL 2 MG/2ML IJ SOLN
INTRAMUSCULAR | Status: AC
  Filled 2020-05-12: qty 2

## 2020-05-12 MED ORDER — ACETAMINOPHEN 500 MG PO TABS: 1000.0000 mg | ORAL_TABLET | Freq: Once | ORAL | Status: DC

## 2020-05-12 MED ORDER — LIDOCAINE 2% (20 MG/ML) 5 ML SYRINGE
INTRAMUSCULAR | Status: AC
  Filled 2020-05-12: qty 5

## 2020-05-12 MED ORDER — MIDAZOLAM HCL 2 MG/2ML IJ SOLN: 0.5000 mg | Freq: Once | INTRAMUSCULAR | Status: DC | PRN

## 2020-05-12 MED ORDER — EPHEDRINE 5 MG/ML INJ
INTRAVENOUS | Status: AC
  Filled 2020-05-12: qty 10

## 2020-05-12 MED ORDER — GABAPENTIN 300 MG PO CAPS
300.0000 mg | ORAL_CAPSULE | ORAL | Status: AC
  Administered 2020-05-12: 300 mg via ORAL
  Filled 2020-05-12: qty 1

## 2020-05-12 MED ORDER — PROPOFOL 500 MG/50ML IV EMUL
INTRAVENOUS | Status: DC | PRN
  Administered 2020-05-12: 25 ug/kg/min via INTRAVENOUS

## 2020-05-12 MED ORDER — FENTANYL CITRATE (PF) 250 MCG/5ML IJ SOLN
INTRAMUSCULAR | Status: DC | PRN
  Administered 2020-05-12 (×4): 50 ug via INTRAVENOUS

## 2020-05-12 MED ORDER — PROPOFOL 10 MG/ML IV BOLUS
INTRAVENOUS | Status: AC
  Filled 2020-05-12: qty 40

## 2020-05-12 MED ORDER — EPHEDRINE SULFATE-NACL 50-0.9 MG/10ML-% IV SOSY
PREFILLED_SYRINGE | INTRAVENOUS | Status: DC | PRN
  Administered 2020-05-12: 10 mg via INTRAVENOUS

## 2020-05-12 MED ORDER — CEFAZOLIN SODIUM-DEXTROSE 2-4 GM/100ML-% IV SOLN
2.0000 g | INTRAVENOUS | Status: AC
  Administered 2020-05-12: 2 g via INTRAVENOUS
  Filled 2020-05-12: qty 100

## 2020-05-12 MED ORDER — PHENYLEPHRINE 40 MCG/ML (10ML) SYRINGE FOR IV PUSH (FOR BLOOD PRESSURE SUPPORT)
PREFILLED_SYRINGE | INTRAVENOUS | Status: DC | PRN
  Administered 2020-05-12: 80 ug via INTRAVENOUS
  Administered 2020-05-12: 40 ug via INTRAVENOUS

## 2020-05-12 MED ORDER — MIDAZOLAM HCL 5 MG/5ML IJ SOLN
INTRAMUSCULAR | Status: DC | PRN
  Administered 2020-05-12 (×2): 1 mg via INTRAVENOUS

## 2020-05-12 MED ORDER — CHLORHEXIDINE GLUCONATE 0.12 % MT SOLN
15.0000 mL | Freq: Once | OROMUCOSAL | Status: AC
  Administered 2020-05-12: 15 mL via OROMUCOSAL
  Filled 2020-05-12: qty 15

## 2020-05-12 SURGICAL SUPPLY — 43 items
APPLICATOR COTTON TIP 6 STRL (MISCELLANEOUS) ×1 IMPLANT
APPLICATOR COTTON TIP 6IN STRL (MISCELLANEOUS) ×2
BLADE CLIPPER SURG (BLADE) IMPLANT
CANISTER SUCT 3000ML PPV (MISCELLANEOUS) ×2 IMPLANT
CHLORAPREP W/TINT 26 (MISCELLANEOUS) ×2 IMPLANT
COVER SURGICAL LIGHT HANDLE (MISCELLANEOUS) ×2 IMPLANT
COVER WAND RF STERILE (DRAPES) ×2 IMPLANT
DERMABOND ADHESIVE PROPEN (GAUZE/BANDAGES/DRESSINGS) ×1
DERMABOND ADVANCED (GAUZE/BANDAGES/DRESSINGS)
DERMABOND ADVANCED .7 DNX12 (GAUZE/BANDAGES/DRESSINGS) IMPLANT
DERMABOND ADVANCED .7 DNX6 (GAUZE/BANDAGES/DRESSINGS) ×1 IMPLANT
DRAPE LAPAROSCOPIC ABDOMINAL (DRAPES) ×2 IMPLANT
ELECT REM PT RETURN 9FT ADLT (ELECTROSURGICAL) ×2
ELECTRODE REM PT RTRN 9FT ADLT (ELECTROSURGICAL) ×1 IMPLANT
GAUZE SPONGE 4X4 12PLY STRL (GAUZE/BANDAGES/DRESSINGS) IMPLANT
GLOVE BIO SURGEON STRL SZ8 (GLOVE) ×2 IMPLANT
GLOVE SRG 8 PF TXTR STRL LF DI (GLOVE) ×1 IMPLANT
GLOVE SURG SYN 7.5  E (GLOVE) ×2
GLOVE SURG SYN 7.5 E (GLOVE) ×1 IMPLANT
GLOVE SURG UNDER POLY LF SZ8 (GLOVE) ×2
GOWN STRL REUS W/ TWL LRG LVL3 (GOWN DISPOSABLE) ×1 IMPLANT
GOWN STRL REUS W/ TWL XL LVL3 (GOWN DISPOSABLE) ×1 IMPLANT
GOWN STRL REUS W/TWL LRG LVL3 (GOWN DISPOSABLE) ×2
GOWN STRL REUS W/TWL XL LVL3 (GOWN DISPOSABLE) ×2
KIT BASIN OR (CUSTOM PROCEDURE TRAY) ×2 IMPLANT
KIT TURNOVER KIT B (KITS) ×2 IMPLANT
MESH VENTRALEX ST 8CM LRG (Mesh General) ×2 IMPLANT
NEEDLE 22X1 1/2 (OR ONLY) (NEEDLE) IMPLANT
NS IRRIG 1000ML POUR BTL (IV SOLUTION) ×2 IMPLANT
PACK GENERAL/GYN (CUSTOM PROCEDURE TRAY) ×2 IMPLANT
PAD ARMBOARD 7.5X6 YLW CONV (MISCELLANEOUS) ×2 IMPLANT
PENCIL SMOKE EVACUATOR (MISCELLANEOUS) ×2 IMPLANT
STAPLER VISISTAT 35W (STAPLE) IMPLANT
SUT MNCRL AB 4-0 PS2 18 (SUTURE) IMPLANT
SUT NOVA NAB GS-21 0 18 T12 DT (SUTURE) ×4 IMPLANT
SUT PROLENE 0 CT 2 (SUTURE) IMPLANT
SUT VIC AB 3-0 SH 27 (SUTURE)
SUT VIC AB 3-0 SH 27X BRD (SUTURE) IMPLANT
SYR CONTROL 10ML LL (SYRINGE) IMPLANT
TOWEL GREEN STERILE (TOWEL DISPOSABLE) ×2 IMPLANT
TOWEL GREEN STERILE FF (TOWEL DISPOSABLE) ×2 IMPLANT
TRAY FOLEY W/BAG SLVR 16FR (SET/KITS/TRAYS/PACK)
TRAY FOLEY W/BAG SLVR 16FR ST (SET/KITS/TRAYS/PACK) IMPLANT

## 2020-05-12 NOTE — Progress Notes (Signed)
.  5 mL IV dilaudid wasted in steri-cycle with Octaviano Glow, RN after patient was discharged.

## 2020-05-12 NOTE — Anesthesia Postprocedure Evaluation (Signed)
Anesthesia Post Note  Patient: Meghan Davila  Procedure(s) Performed: HERNIA REPAIR INCISIONAL WITH MESH, REMOVAL ENDOMETRIOMA (N/A Abdomen)     Patient location during evaluation: PACU Anesthesia Type: General Level of consciousness: awake and alert, patient cooperative and oriented Pain management: pain level controlled Vital Signs Assessment: post-procedure vital signs reviewed and stable Respiratory status: spontaneous breathing, nonlabored ventilation and respiratory function stable Cardiovascular status: blood pressure returned to baseline and stable Postop Assessment: no apparent nausea or vomiting Anesthetic complications: no   No complications documented.  Last Vitals:  Vitals:   05/12/20 1220 05/12/20 1235  BP: (!) 133/93 140/89  Pulse: 84 95  Resp: 17 (!) 2  Temp:    SpO2: 95% 98%    Last Pain:  Vitals:   05/12/20 1235  TempSrc:   PainSc: 6                  Abigael Mogle,E. Raylen Tangonan

## 2020-05-12 NOTE — Anesthesia Procedure Notes (Signed)
Procedure Name: Intubation Date/Time: 05/12/2020 11:03 AM Performed by: Hendricks Limes, CRNA Pre-anesthesia Checklist: Patient identified, Emergency Drugs available, Suction available and Patient being monitored Patient Re-evaluated:Patient Re-evaluated prior to induction Oxygen Delivery Method: Circle system utilized Preoxygenation: Pre-oxygenation with 100% oxygen Induction Type: IV induction Ventilation: Mask ventilation without difficulty Laryngoscope Size: Mac and 3 Grade View: Grade I Tube type: Oral Tube size: 7.0 mm Number of attempts: 1 Airway Equipment and Method: Stylet Placement Confirmation: ETT inserted through vocal cords under direct vision,  positive ETCO2 and breath sounds checked- equal and bilateral Secured at: 21 cm Tube secured with: Tape Dental Injury: Teeth and Oropharynx as per pre-operative assessment

## 2020-05-12 NOTE — Anesthesia Preprocedure Evaluation (Addendum)
Anesthesia Evaluation  Patient identified by MRN, date of birth, ID band Patient awake    Reviewed: Allergy & Precautions, NPO status , Patient's Chart, lab work & pertinent test results  History of Anesthesia Complications Negative for: history of anesthetic complications  Airway Mallampati: I  TM Distance: >3 FB     Dental  (+) Dental Advisory Given   Pulmonary former smoker,  05/08/2020 SARS coronavirus NEG   breath sounds clear to auscultation       Cardiovascular hypertension, Pt. 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    GI/Hepatic Neg liver ROS, GERD  Controlled,  Endo/Other  negative endocrine ROSGestational diabetes only  Renal/GU negative Renal ROS     Musculoskeletal   Abdominal (+) + obese,   Peds  Hematology negative hematology ROS (+)   Anesthesia Other Findings   Reproductive/Obstetrics                            Anesthesia Physical Anesthesia Plan  ASA: III  Anesthesia Plan: General   Post-op Pain Management:    Induction: Intravenous  PONV Risk Score and Plan: 3 and Ondansetron, Dexamethasone and Treatment may vary due to age or medical condition  Airway Management Planned: Oral ETT  Additional Equipment: None  Intra-op Plan:   Post-operative Plan: Extubation in OR  Informed Consent: I have reviewed the patients History and Physical, chart, labs and discussed the procedure including the risks, benefits and alternatives for the proposed anesthesia with the patient or authorized representative who has indicated his/her understanding and acceptance.     Dental advisory given  Plan Discussed with: CRNA and Surgeon  Anesthesia Plan Comments:        Anesthesia Quick Evaluation

## 2020-05-12 NOTE — Op Note (Signed)
  05/12/2020  11:53 AM  PATIENT:  Meghan Davila  45 y.o. female  PRE-OPERATIVE DIAGNOSIS:  incisional hernia, endrometrioma at umbilicus  POST-OPERATIVE DIAGNOSIS:  incisional hernia, endrometrioma at umbilicus  PROCEDURE:  Procedure(s): HERNIA REPAIR INCISIONAL WITH MESH REMOVAL ENDOMETRIOMA  SURGEON:  Surgeon(s): Violeta Gelinas, MD  ASSISTANTS: none   ANESTHESIA:   local and general  EBL:  Total I/O In: 900 [I.V.:800; IV Piggyback:100] Out: 5 [Blood:5]  BLOOD ADMINISTERED:none  DRAINS: none   SPECIMEN:  Excision  DISPOSITION OF SPECIMEN:  PATHOLOGY  COUNTS:  YES  DICTATION: .Dragon Dictation Findings: Thickened tissue at the umbilicus consistent with endometrioma, incisional hernia  Procedure in detail: Informed consent was obtained.  She was identified in the preop holding area.  She received intravenous antibiotics.  She was brought to the operating room and general endotracheal anesthesia was administered by the anesthesia staff.  Her abdomen was prepped and draped in a sterile fashion.  We did a timeout procedure.  Local was injected above the umbilicus.  I made a midline incision above the umbilicus and subcutaneous tissues were dissected down revealing the hernia sac.  This was circumferentially dissected and removed.  The fascia was cleared away circumferentially to facilitate repair of the hernia.  The umbilical area was explored.  There was some thickened tissue above the umbilicus consistent with endometrioma.  This was excised and sent to pathology.  I removed all the palpable thickened tissue that I could without removing her umbilicus itself.  She did not want that done.  I then returned my attention to repairing the hernia.  I placed a 8.3 cm Ventralex mesh.  This was done in an inlay fashion and secured with interrupted 0 Novafil.  I was also able to close the fascia over the mesh using 0 Novafil in an interrupted fashion.  The mesh laid nicely.  The wound was  irrigated.  I ensured hemostasis.  I injected some additional local anesthetic.  Subcutaneous tissues were closed with running 3-0 Vicryl.  The skin was closed with running 4-0 Monocryl followed by Dermabond.  All counts were correct.  She tolerated the procedure well without apparent complication and was taken recovery in stable condition. PATIENT DISPOSITION:  PACU - hemodynamically stable.   Delay start of Pharmacological VTE agent (>24hrs) due to surgical blood loss or risk of bleeding:  no  Violeta Gelinas, MD, MPH, FACS Pager: [phone]  2/28/202211:53 AM

## 2020-05-12 NOTE — Interval H&P Note (Signed)
History and Physical Interval Note:  05/12/2020 10:34 AM  Meghan Davila  has presented today for surgery, with the diagnosis of incisional hernia, endrometrioma at umbilicus.  The various methods of treatment have been discussed with the patient and family. After consideration of risks, benefits and other options for treatment, the patient has consented to  Procedure(s) with comments: HERNIA REPAIR INCISIONAL WITH MESH, REMOVAL ENDOMETRIOMA (N/A) - 60 MIN TOTAL - IQUEUE TIME IS HELD as a surgical intervention.  The patient's history has been reviewed, patient examined, no change in status, stable for surgery.  I have reviewed the patient's chart and labs.  Questions were answered to the patient's satisfaction.     Liz Malady

## 2020-05-12 NOTE — Transfer of Care (Signed)
Immediate Anesthesia Transfer of Care Note  Patient: Meghan Davila  Procedure(s) Performed: HERNIA REPAIR INCISIONAL WITH MESH, REMOVAL ENDOMETRIOMA (N/A Abdomen)  Patient Location: PACU  Anesthesia Type:General  Level of Consciousness: awake, drowsy, patient cooperative and responds to stimulation  Airway & Oxygen Therapy: Patient Spontanous Breathing and Patient connected to face mask oxygen  Post-op Assessment: Report given to RN, Post -op Vital signs reviewed and stable and Patient moving all extremities X 4  Post vital signs: Reviewed and stable  Last Vitals:  Vitals Value Taken Time  BP    Temp    Pulse 92 05/12/20 1205  Resp 24 05/12/20 1205  SpO2 100 % 05/12/20 1205  Vitals shown include unvalidated device data.  Last Pain:  Vitals:   05/12/20 0912  TempSrc:   PainSc: 0-No pain         Complications: No complications documented.

## 2020-05-13 ENCOUNTER — Encounter (HOSPITAL_COMMUNITY): Payer: Self-pay | Admitting: General Surgery

## 2020-05-13 LAB — SURGICAL PATHOLOGY

## 2021-04-23 ENCOUNTER — Encounter: Payer: Self-pay | Admitting: Emergency Medicine

## 2021-04-23 ENCOUNTER — Other Ambulatory Visit: Payer: Self-pay

## 2021-04-23 ENCOUNTER — Ambulatory Visit
Admission: EM | Admit: 2021-04-23 | Discharge: 2021-04-23 | Disposition: A | Discharge status: Home / Self Care | Payer: BC Managed Care – PPO | Attending: Physician Assistant | Admitting: Physician Assistant

## 2021-04-23 DIAGNOSIS — R198 Other specified symptoms and signs involving the digestive system and abdomen: Secondary | ICD-10-CM | POA: Diagnosis not present

## 2021-04-23 MED ORDER — DOXYCYCLINE HYCLATE 100 MG PO CAPS: 100.0000 mg | ORAL_CAPSULE | Freq: Two times a day (BID) | ORAL | 0 refills | Status: DC

## 2021-04-23 NOTE — ED Provider Notes (Signed)
[facility]    CSN: [id number] Arrival date & time: 04/23/21  1018      History   Chief Complaint Chief Complaint  Patient presents with   Navel drainage    HPI Meghan Davila is a 46 y.o. female.   Patient here today for evaluation of purulent drainage from her navel that started 2 days ago. She has not had fever. She denies any nausea or vomiting. She does have history of umbilical hernia about one year ago that she had repaired. She reports that appearance of umbilicus has not changed.   The history is provided by the patient.   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   Hypertensive urgency 01/11/2018   Headache 01/11/2018   Sphenoid sinusitis 01/11/2018   Umbilical pain 07/18/2012   Hydrocephalus (HCC) 09/13/2011   Gestational diabetes mellitus in pregnancy 09/13/2011    Past Surgical History:  Procedure Laterality Date   BRAIN SURGERY  2001   shunt put in, no current problems per pt as of 07/13/19   BREAST REDUCTION SURGERY  2008   BREAST SURGERY Bilateral 2008   reduction   CESAREAN SECTION  10/22/09   CESAREAN SECTION  2002   CHOLECYSTECTOMY  2004   COLONOSCOPY     EXCISION MASS ABDOMINAL N/A 07/17/2019   Procedure: EXCISION OF SUPRAUMBILICAL MASS;  Surgeon: Violeta Gelinas, MD;  Location: [facility] OR;  Service: General;  Laterality: N/A;   INCISIONAL HERNIA REPAIR N/A 05/12/2020   Procedure: HERNIA REPAIR INCISIONAL WITH MESH, REMOVAL ENDOMETRIOMA;  Surgeon: Violeta Gelinas, MD;  Location: [facility] OR;  Service: General;  Laterality: N/A;  60 MIN TOTAL - IQUEUE TIME IS HELD   LAPAROSCOPIC TUBAL LIGATION Bilateral 10/24/2018   Procedure: LAPAROSCOPIC TUBAL  LIGATION By Salpingectomy;  Surgeon: Shea Evans, MD;  Location: [facility];  Service: Gynecology;  Laterality: Bilateral;   TUBAL LIGATION     UPPER GI ENDOSCOPY      OB History     Gravida  2   Para  2   Term      Preterm      AB      Living  2      SAB      IAB      Ectopic      Multiple      Live Births               Home Medications    Prior to Admission medications   Medication Sig Start Date End Date Taking? Authorizing Provider  doxycycline (VIBRAMYCIN) 100 MG capsule Take 1 capsule (100 mg total) by mouth 2 (two) times daily. 04/23/21  Yes Tomi Bamberger, PA-C  acetaminophen (TYLENOL) 500 MG tablet Take 500-1,000 mg by mouth every 6 (six) hours as needed for moderate pain or headache.    [provider]  amLODipine (NORVASC) 10 MG tablet Take 1 tablet (10 mg total) by mouth daily. 01/13/18   Calvert Cantor, MD  losartan-hydrochlorothiazide (HYZAAR) 100-25 MG tablet Take 1 tablet by mouth daily.    [provider]  ORILISSA 150 MG TABS Take 150  mg by mouth daily. 04/09/20   [provider]  oxyCODONE (OXY IR/ROXICODONE) 5 MG immediate release tablet Take 1 tablet (5 mg total) by mouth every 6 (six) hours as needed for severe pain. 05/12/20   Violeta Gelinas, MD  traMADol (ULTRAM) 50 MG tablet Take 50 mg by mouth every 6 (six) hours as needed for severe pain.    [provider]    Family History Family History  Problem Relation Age of Onset   Hypertension Mother    Hypertension Sister    Hypertension Maternal Grandmother    Cancer Maternal Grandmother    Hypertension Maternal Grandfather    Diabetes Maternal Grandfather     Social History Social History   Tobacco Use   Smoking status: Former    Years: 8.00    Types: Cigarettes    Quit date: 12/2017    Years since quitting: 3.3   Smokeless tobacco: Never  Vaping Use   Vaping Use: Never used  Substance Use Topics   Alcohol use: No   Drug  use: No     Allergies   Penicillins   Review of Systems Review of Systems  Constitutional:  Negative for chills and fever.  Eyes:  Negative for discharge and redness.  Respiratory:  Negative for shortness of breath.   Gastrointestinal:  Negative for abdominal pain, nausea and vomiting.  Skin:  Negative for color change.    Physical Exam Triage Vital Signs ED Triage Vitals [04/23/21 1024]  Enc Vitals Group     BP (!) 175/104     Pulse Rate 91     Resp 16     Temp 98.2 F (36.8 C)     Temp Source Oral     SpO2 97 %     Weight      Height      Head Circumference      Peak Flow      Pain Score 8     Pain Loc      Pain Edu?      Excl. in GC?    No data found.  Updated Vital Signs BP (!) 183/123 (BP Location: Left Arm)    Pulse 91    Temp 98.2 F (36.8 C) (Oral)    Resp 16    SpO2 97%       Physical Exam Vitals and nursing note reviewed.  Constitutional:      General: She is not in acute distress.    Appearance: Normal appearance. She is not ill-appearing.  HENT:     Head: Normocephalic and atraumatic.  Eyes:     Conjunctiva/sclera: Conjunctivae normal.  Cardiovascular:     Rate and Rhythm: Normal rate.  Pulmonary:     Effort: Pulmonary effort is normal.  Abdominal:     Comments: Mild hyperpigmentation with mild swelling around umbilicus, surgical changes noted, no significant drainage apparent on exam  Neurological:     Mental Status: She is alert.  Psychiatric:        Mood and Affect: Mood normal.        Behavior: Behavior normal.        Thought Content: Thought content normal.     UC Treatments / Results  Labs (all labs ordered are listed, but only abnormal results are displayed) Labs Reviewed - No data to display  EKG   Radiology No results found.  Procedures Procedures (including critical care time)  Medications Ordered in UC Medications - No data to display  Initial  Impression / Assessment and Plan / UC Course  I have reviewed the  triage vital signs and the nursing notes.  Pertinent labs & imaging results that were available during my care of the patient were reviewed by me and considered in my medical decision making (see chart for details).    Will treat with antibiotic to cover possible infection but strongly recommended patient be seen by her surgeon if symptoms do not improve or worsen in any way. Blood pressure elevated today. She has not taken her blood pressure medication today.   Final Clinical Impressions(s) / UC Diagnoses   Final diagnoses:  Umbilicus discharge     Discharge Instructions       Please follow up with general surgery if no improvement with antibiotics or if symptoms worsen in any way.      ED Prescriptions     Medication Sig Dispense Auth. Provider   doxycycline (VIBRAMYCIN) 100 MG capsule Take 1 capsule (100 mg total) by mouth 2 (two) times daily. 20 capsule Tomi Bamberger, PA-C      PDMP not reviewed this encounter.   Tomi Bamberger, PA-C 04/23/21 1109

## 2021-04-23 NOTE — ED Triage Notes (Signed)
Reports purulent drainage from navel starting 2 days prior.

## 2021-04-23 NOTE — Discharge Instructions (Addendum)
°  Please follow up with general surgery if no improvement with antibiotics or if symptoms worsen in any way.

## 2021-05-08 ENCOUNTER — Telehealth: Payer: Self-pay | Admitting: Hematology and Oncology

## 2021-05-08 NOTE — Telephone Encounter (Signed)
Scheduled appt per 2/23 referral. Pt is aware of appt date and time. Pt is aware to arrive 15 mins prior to appt time and to bring and updated insurance card. Pt is aware of appt location.   °

## 2021-05-11 ENCOUNTER — Ambulatory Visit (HOSPITAL_COMMUNITY)
Admission: EM | Admit: 2021-05-11 | Discharge: 2021-05-11 | Disposition: A | Discharge status: Home / Self Care | Payer: BC Managed Care – PPO | Attending: Internal Medicine | Admitting: Internal Medicine

## 2021-05-11 ENCOUNTER — Encounter (HOSPITAL_COMMUNITY): Payer: Self-pay | Admitting: Emergency Medicine

## 2021-05-11 ENCOUNTER — Other Ambulatory Visit: Payer: Self-pay

## 2021-05-11 DIAGNOSIS — R1905 Periumbilic swelling, mass or lump: Secondary | ICD-10-CM | POA: Diagnosis not present

## 2021-05-11 MED ORDER — KETOROLAC TROMETHAMINE 30 MG/ML IJ SOLN
30.0000 mg | Freq: Once | INTRAMUSCULAR | Status: AC
  Administered 2021-05-11: 30 mg via INTRAMUSCULAR

## 2021-05-11 MED ORDER — IBUPROFEN 600 MG PO TABS
600.0000 mg | ORAL_TABLET | Freq: Four times a day (QID) | ORAL | 0 refills | Status: AC | PRN
Start: 2021-05-11 — End: ?

## 2021-05-11 MED ORDER — TRAMADOL HCL 50 MG PO TABS: 50.0000 mg | ORAL_TABLET | Freq: Two times a day (BID) | ORAL | 0 refills | Status: DC | PRN

## 2021-05-11 MED ORDER — KETOROLAC TROMETHAMINE 30 MG/ML IJ SOLN
INTRAMUSCULAR | Status: AC
  Filled 2021-05-11: qty 1

## 2021-05-11 NOTE — ED Triage Notes (Addendum)
Pt reports shortness of breath and bleeding from the navel for a "couple" months. Pt states she was dx last week with anemia.  Pt also reports a history of abdominal surgery with last surgery being a hernia repair in the beginning of 2022.

## 2021-05-11 NOTE — Medical Student Note (Addendum)
[facility] Statistician Note For educational purposes for Medical, PA and NP students only and not part of the legal medical record.   CSN: [id number] Arrival date & time: 05/11/21  0815      History   Chief Complaint Chief Complaint  Patient presents with   Shortness of Breath   Navel Bleeding    HPI Meghan Davila is a 46 y.o. female.  46 y/o F with a PMHx of anemia, HTN, and HLD presents today for constant periumbilical pain x 1 day. Pt had a similar pain 2 weeks ago for which she was prescribed doxycycline as she was having drainage of pus and blood from umbilicus region. She had an episode of bleeding from umbilicus 1 day ago associated with 9/10 pain, but hemostasis was obtained without any interventions. She has had several laparoscopic surgeries in her abdomen, most recent being a hernia repair 46 y/o and an endometrial mass removal. Pt states her periods are irregular and notices the pain come on during her cycles. She admits to one episode of SOB yesterday associated with chest pain that lasted 15 min which radiated to right arm as well as numbness/ tingling in right hand. Although, she is not currently having sob or chest pain. Admits to diaphoresis, chill, dizziness, periumbilical itchiness, constipation and bloating. Denies wheezing, headaches, fevers, dysuria, hematuria, or blood in stools. Periumbilical pain is worse when she feels bloated, but is relieved with heating pad.   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   Hypertensive urgency 01/11/2018   Headache 01/11/2018   Sphenoid sinusitis [id number]   Umbilical pain [id number]   Hydrocephalus ([place]) 09/13/2011   Gestational diabetes mellitus in pregnancy 09/13/2011    Past Surgical History:  Procedure Laterality Date   BRAIN SURGERY  2001   shunt put in, no current problems per pt as of 07/13/19   BREAST REDUCTION SURGERY  2008   BREAST SURGERY Bilateral 2008   reduction   CESAREAN SECTION  10/22/09   CESAREAN SECTION  2002   CHOLECYSTECTOMY  2004   COLONOSCOPY     EXCISION MASS ABDOMINAL N/A 07/17/2019   Procedure: EXCISION OF SUPRAUMBILICAL MASS;  Surgeon: Georganna Skeans, MD;  Location: [facility];  Service: General;  Laterality: N/A;   [place] N/A 05/12/2020   Procedure: HERNIA REPAIR INCISIONAL WITH MESH, REMOVAL ENDOMETRIOMA;  Surgeon: Georganna Skeans, MD;  Location: [place];  Service: General;  Laterality: N/A;  60 MIN TOTAL - [place] Bilateral 10/24/2018   Procedure: LAPAROSCOPIC TUBAL LIGATION By Salpingectomy;  Surgeon: Azucena Fallen, MD;  Location: [facility];  Service: Gynecology;  Laterality: Bilateral;   TUBAL LIGATION     UPPER GI ENDOSCOPY      OB History     Gravida  2   Para  2   Term      Preterm      AB  Living  2      SAB      IAB      Ectopic      Multiple      Live Births               Home Medications    Prior to Admission medications   Medication Sig Start Date End Date Taking? Authorizing Provider  acetaminophen (TYLENOL) 500 MG tablet Take 500-1,000 mg by mouth every 6 (six) hours as needed for moderate pain or headache.    [provider]  amLODipine (NORVASC) 10 MG tablet Take 1 tablet (10 mg total) by mouth daily. 01/13/18   Debbe Odea, MD  doxycycline (VIBRAMYCIN) 100 MG capsule Take 1 capsule (100 mg total) by mouth 2 (two) times daily. 04/23/21   Francene Finders,  PA-C  losartan-hydrochlorothiazide (HYZAAR) 100-25 MG tablet Take 1 tablet by mouth daily.    [provider]  ORILISSA 150 MG TABS Take 150 mg by mouth daily. 04/09/20   [provider]  oxyCODONE (OXY IR/ROXICODONE) 5 MG immediate release tablet Take 1 tablet (5 mg total) by mouth every 6 (six) hours as needed for severe pain. 05/12/20   Georganna Skeans, MD  traMADol (ULTRAM) 50 MG tablet Take 50 mg by mouth every 6 (six) hours as needed for severe pain.    [provider]    Family History Family History  Problem Relation Age of Onset   Hypertension Mother    Hypertension Sister    Hypertension Maternal Grandmother    Cancer Maternal Grandmother    Hypertension Maternal Grandfather    Diabetes Maternal Grandfather     Social History Social History   Tobacco Use   Smoking status: Former    Years: 8.00    Types: Cigarettes    Quit date: 12/2017    Years since quitting: 3.4   Smokeless tobacco: Never  Vaping Use   Vaping Use: Never used  Substance Use Topics   Alcohol use: No   Drug use: No     Allergies   Penicillins   Review of Systems Review of Systems  Constitutional:  Positive for chills and diaphoresis. Negative for fever.  Respiratory:  Positive for shortness of breath. Negative for wheezing.   Cardiovascular:  Positive for chest pain.  Gastrointestinal:  Positive for abdominal pain and constipation. Negative for blood in stool.  Genitourinary:  Negative for dysuria and hematuria.  Skin:        Periumbilical itchiness   Neurological:  Positive for dizziness and numbness. Negative for headaches.    Physical Exam Updated Vital Signs BP (!) 149/88 (BP Location: Left Arm)    Pulse 94    Temp 98.1 F (36.7 C) (Oral)    Resp 16    Ht 5\' 1"  (1.549 m)    Wt 77.1 kg    SpO2 97%    BMI 32.12 kg/m   Physical Exam Abdominal:     General: Bowel sounds are normal.     Comments: Pt has a surgical scar in right upper quadrant as  well as a vertical c section scar.  Tenderness to palpation of periumbilical region.       ED Treatments / Results  Labs (all labs ordered are listed, but only abnormal results are displayed) Labs Reviewed - No data to display  EKG  Radiology No results found.  Procedures Procedures (including critical care time)  Medications Ordered in ED Medications - No data to display  Initial Impression / Assessment and Plan / ED Course  I have reviewed the triage vital signs and the nursing notes.  Pertinent labs & imaging results that were available during my care of the patient were reviewed by me and considered in my medical decision making (see chart for details).     Advised patient that her pain and periumbilical bleeding is likely secondary to endometrial tissue growth near her umbilicus region. She will need to follow up with her surgeon for evaluation and removal. She can continue to use her heating pad as well as take ibuprofen as needed for pain relief.   Final Clinical Impressions(s) / ED Diagnoses   Final diagnoses:  None    New Prescriptions New Prescriptions   No medications on file

## 2021-05-11 NOTE — Discharge Instructions (Signed)
Please continue heating pad use to help with pain Take medications as prescribed Reach out to your general surgeon for follow-up You may have an endometrioma in the periumbilical region If your symptoms worsen please go to the emergency department for further evaluation.

## 2021-05-13 NOTE — ED Provider Notes (Addendum)
[facility]    CSN: [id number] Arrival date & time: 05/11/21  0815      History   Chief Complaint Chief Complaint  Patient presents with   Shortness of Breath   Navel Bleeding    HPI Meghan Davila is a 46 y.o. female comes to the urgent care with periumbilical pain for few days duration.  Patient has a history of endometrioma of the periumbilical region status post resection in 2022.  She also had periumbilical hernia which was repaired at the same time.  Patient endorses sharp, throbbing pain which is typically worse when she started her menses.  Pain is aggravated by palpation.  No known relieving factors.  Not associated with abdominal distention nausea or vomiting.  No known relieving factors.  She has had bloody discharge from the area over the past couple of days.  No fever or chills.  No trauma to the abdomen.  No abdominal distention nausea or vomiting.  She is complaining of some shortness of breath on an intermittent basis over the past couple of months.   HPI  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   Hypertensive urgency 01/11/2018   Headache 01/11/2018   Sphenoid sinusitis 01/11/2018   Umbilical pain 07/18/2012   Hydrocephalus (HCC) 09/13/2011   Gestational diabetes mellitus in pregnancy 09/13/2011    Past Surgical History:  Procedure Laterality Date   BRAIN SURGERY  2001   shunt put in, no current problems per pt as of 07/13/19   BREAST REDUCTION SURGERY  2008   BREAST SURGERY Bilateral 2008   reduction   CESAREAN SECTION  10/22/09   CESAREAN SECTION  2002   CHOLECYSTECTOMY  2004   COLONOSCOPY     EXCISION MASS ABDOMINAL N/A 07/17/2019    Procedure: EXCISION OF SUPRAUMBILICAL MASS;  Surgeon: Violeta Gelinas, MD;  Location: [facility] OR;  Service: General;  Laterality: N/A;   INCISIONAL HERNIA REPAIR N/A 05/12/2020   Procedure: HERNIA REPAIR INCISIONAL WITH MESH, REMOVAL ENDOMETRIOMA;  Surgeon: Violeta Gelinas, MD;  Location: [facility] OR;  Service: General;  Laterality: N/A;  60 MIN TOTAL - IQUEUE TIME IS HELD   LAPAROSCOPIC TUBAL LIGATION Bilateral 10/24/2018   Procedure: LAPAROSCOPIC TUBAL LIGATION By Salpingectomy;  Surgeon: Shea Evans, MD;  Location: [facility];  Service: Gynecology;  Laterality: Bilateral;   TUBAL LIGATION     UPPER GI ENDOSCOPY      OB History     Gravida  2   Para  2   Term      Preterm      AB      Living  2      SAB      IAB      Ectopic      Multiple      Live Births               Home Medications    Prior to Admission medications   Medication Sig Start Date End Date Taking? Authorizing Provider  ibuprofen (ADVIL) 600 MG tablet Take 1 tablet (600 mg total) by mouth every 6 (six) hours as needed. 05/11/21  Yes Rozelia Catapano, Britta Mccreedy, MD  acetaminophen (TYLENOL) 500 MG tablet Take 500-1,000 mg by mouth every 6 (six) hours as needed for moderate pain or headache.    [provider]  amLODipine (NORVASC) 10 MG tablet Take 1 tablet (10 mg total) by mouth daily. 01/13/18   Calvert Cantor, MD  doxycycline (VIBRAMYCIN) 100 MG capsule Take 1 capsule (100 mg total) by mouth 2 (two) times daily. 04/23/21   Tomi Bamberger, PA-C  losartan-hydrochlorothiazide (HYZAAR) 100-25 MG tablet Take 1 tablet by mouth daily.    [provider]  ORILISSA 150 MG TABS Take 150 mg by mouth daily. 04/09/20   [provider]  traMADol (ULTRAM) 50 MG tablet Take 1 tablet (50 mg total) by mouth every 12 (twelve) hours as needed. 05/11/21  Yes Cortina Vultaggio, Britta Mccreedy, MD    Family History Family History  Problem Relation Age of Onset   Hypertension Mother    Hypertension Sister     Hypertension Maternal Grandmother    Cancer Maternal Grandmother    Hypertension Maternal Grandfather    Diabetes Maternal Grandfather     Social History Social History   Tobacco Use   Smoking status: Former    Years: 8.00    Types: Cigarettes    Quit date: 12/2017    Years since quitting: 3.4   Smokeless tobacco: Never  Vaping Use   Vaping Use: Never used  Substance Use Topics   Alcohol use: No   Drug use: No     Allergies   Penicillins   Review of Systems Review of Systems  Respiratory: Negative.    Gastrointestinal:  Positive for abdominal pain. Negative for diarrhea, nausea and vomiting.  Musculoskeletal: Negative.     Physical Exam Triage Vital Signs ED Triage Vitals  Enc Vitals Group     BP 05/11/21 0834 (!) 149/88     Pulse Rate 05/11/21 0834 94     Resp 05/11/21 0834 16     Temp 05/11/21 0834 98.1 F (36.7 C)     Temp Source 05/11/21 0834 Oral     SpO2 05/11/21 0834 97 %     Weight 05/11/21 0835 169 lb 15.6 oz (77.1 kg)     Height 05/11/21 0835 5\' 1"  (1.549 m)     Head Circumference --      Peak Flow --      Pain Score 05/11/21 0835 8     Pain Loc --      Pain Edu? --      Excl. in GC? --    No data found.  Updated Vital Signs BP (!) 149/88 (BP Location: Left Arm)    Pulse 94    Temp 98.1 F (36.7 C) (Oral)    Resp 16    Ht 5\' 1"  (1.549 m)    Wt 77.1 kg    SpO2 97%    BMI 32.12 kg/m   Visual Acuity Right Eye Distance:   Left Eye Distance:   Bilateral Distance:    Right Eye Near:   Left Eye Near:    Bilateral Near:     Physical Exam Vitals and nursing note reviewed.  Constitutional:      General: She is not in acute distress.    Appearance: She is not ill-appearing.  Pulmonary:     Breath sounds: No decreased breath sounds, wheezing, rhonchi or rales.  Abdominal:     Palpations: Abdomen is soft. There is mass.     Comments: Tender firm mass  in the supraumbilical region.  Few nodular lesions in the subcutaneous region in the  periumbilical region.  Mass is tender to touch.  Measures about 2 inches in the longest diameter.  No bruising noted.  Neurological:     Mental Status: She is alert.     UC Treatments / Results  Labs (all labs ordered are listed, but only abnormal results are displayed) Labs Reviewed - No data to display  EKG   Radiology No results found.  Procedures Procedures (including critical care time)  Medications Ordered in UC Medications  ketorolac (TORADOL) 30 MG/ML injection 30 mg (30 mg Intramuscular Given 05/11/21 0951)    Initial Impression / Assessment and Plan / UC Course  I have reviewed the triage vital signs and the nursing notes.  Pertinent labs & imaging results that were available during my care of the patient were reviewed by me and considered in my medical decision making (see chart for details).     1.  Periumbilical mass, suspected endometrioma: Patient is advised to call the surgeons office for reevaluation Heating pad use Tramadol as needed for pain Ibuprofen as needed for pain/or fever Return to ED precautions given.  Final Clinical Impressions(s) / UC Diagnoses   Final diagnoses:  Periumbilical mass     Discharge Instructions      Please continue heating pad use to help with pain Take medications as prescribed Reach out to your general surgeon for follow-up You may have an endometrioma in the periumbilical region If your symptoms worsen please go to the emergency department for further evaluation.   ED Prescriptions     Medication Sig Dispense Auth. Provider   ibuprofen (ADVIL) 600 MG tablet Take 1 tablet (600 mg total) by mouth every 6 (six) hours as needed. 30 tablet Vinita Prentiss, Britta Mccreedy, MD   traMADol (ULTRAM) 50 MG tablet Take 1 tablet (50 mg total) by mouth every 12 (twelve) hours as needed. 10 tablet Zalan Shidler, Britta Mccreedy, MD      I have reviewed the PDMP during this encounter.   Merrilee Jansky, MD 05/13/21 1736    Merrilee Jansky,  MD 05/13/21 [phone]

## 2021-05-26 ENCOUNTER — Inpatient Hospital Stay: Payer: BC Managed Care – PPO | Attending: Hematology and Oncology | Admitting: Hematology and Oncology

## 2021-05-26 ENCOUNTER — Other Ambulatory Visit: Payer: Self-pay

## 2021-05-26 DIAGNOSIS — N920 Excessive and frequent menstruation with regular cycle: Secondary | ICD-10-CM | POA: Insufficient documentation

## 2021-05-26 DIAGNOSIS — D649 Anemia, unspecified: Secondary | ICD-10-CM | POA: Insufficient documentation

## 2021-05-26 DIAGNOSIS — N809 Endometriosis, unspecified: Secondary | ICD-10-CM | POA: Insufficient documentation

## 2021-05-26 DIAGNOSIS — N80129 Deep endometriosis of ovary, unspecified ovary: Secondary | ICD-10-CM | POA: Insufficient documentation

## 2021-05-26 DIAGNOSIS — Z809 Family history of malignant neoplasm, unspecified: Secondary | ICD-10-CM | POA: Insufficient documentation

## 2021-05-26 DIAGNOSIS — I10 Essential (primary) hypertension: Secondary | ICD-10-CM | POA: Insufficient documentation

## 2021-05-26 DIAGNOSIS — Z87891 Personal history of nicotine dependence: Secondary | ICD-10-CM | POA: Insufficient documentation

## 2021-05-26 DIAGNOSIS — R400 Somnolence: Secondary | ICD-10-CM | POA: Insufficient documentation

## 2021-05-26 DIAGNOSIS — E611 Iron deficiency: Secondary | ICD-10-CM | POA: Insufficient documentation

## 2021-06-01 ENCOUNTER — Telehealth: Payer: Self-pay | Admitting: Hematology and Oncology

## 2021-06-01 NOTE — Telephone Encounter (Signed)
R/s pt's new hem appt. Pt is aware of new appt date and time.  °

## 2021-06-12 ENCOUNTER — Inpatient Hospital Stay: Payer: BC Managed Care – PPO | Admitting: Hematology and Oncology

## 2021-06-12 ENCOUNTER — Other Ambulatory Visit: Payer: Self-pay

## 2021-06-12 ENCOUNTER — Inpatient Hospital Stay: Payer: BC Managed Care – PPO

## 2021-06-12 DIAGNOSIS — Z87891 Personal history of nicotine dependence: Secondary | ICD-10-CM | POA: Diagnosis not present

## 2021-06-12 DIAGNOSIS — N920 Excessive and frequent menstruation with regular cycle: Secondary | ICD-10-CM | POA: Diagnosis not present

## 2021-06-12 DIAGNOSIS — N809 Endometriosis, unspecified: Secondary | ICD-10-CM | POA: Diagnosis not present

## 2021-06-12 DIAGNOSIS — Z809 Family history of malignant neoplasm, unspecified: Secondary | ICD-10-CM | POA: Diagnosis not present

## 2021-06-12 DIAGNOSIS — D649 Anemia, unspecified: Secondary | ICD-10-CM

## 2021-06-12 DIAGNOSIS — E611 Iron deficiency: Secondary | ICD-10-CM

## 2021-06-12 DIAGNOSIS — R400 Somnolence: Secondary | ICD-10-CM

## 2021-06-12 DIAGNOSIS — I10 Essential (primary) hypertension: Secondary | ICD-10-CM

## 2021-06-12 DIAGNOSIS — N80129 Deep endometriosis of ovary, unspecified ovary: Secondary | ICD-10-CM | POA: Diagnosis not present

## 2021-06-12 LAB — IRON AND IRON BINDING CAPACITY (CC-WL,HP ONLY)
Iron: 76 ug/dL (ref 28–170)
Saturation Ratios: 13 % (ref 10.4–31.8)
TIBC: 570 ug/dL — ABNORMAL HIGH (ref 250–450)
UIBC: 494 ug/dL — ABNORMAL HIGH (ref 148–442)

## 2021-06-12 LAB — CBC WITH DIFFERENTIAL (CANCER CENTER ONLY)
Abs Immature Granulocytes: 0.03 10*3/uL (ref 0.00–0.07)
Basophils Absolute: 0.1 10*3/uL (ref 0.0–0.1)
Basophils Relative: 1 %
Eosinophils Absolute: 0.2 10*3/uL (ref 0.0–0.5)
Eosinophils Relative: 1 %
HCT: 30.1 % — ABNORMAL LOW (ref 36.0–46.0)
Hemoglobin: 9.4 g/dL — ABNORMAL LOW (ref 12.0–15.0)
Immature Granulocytes: 0 %
Lymphocytes Relative: 25 %
Lymphs Abs: 2.8 10*3/uL (ref 0.7–4.0)
MCH: 25.1 pg — ABNORMAL LOW (ref 26.0–34.0)
MCHC: 31.2 g/dL (ref 30.0–36.0)
MCV: 80.3 fL (ref 80.0–100.0)
Monocytes Absolute: 1.2 10*3/uL — ABNORMAL HIGH (ref 0.1–1.0)
Monocytes Relative: 11 %
Neutro Abs: 7 10*3/uL (ref 1.7–7.7)
Neutrophils Relative %: 62 %
Platelet Count: 514 10*3/uL — ABNORMAL HIGH (ref 150–400)
RBC: 3.75 MIL/uL — ABNORMAL LOW (ref 3.87–5.11)
RDW: 15.8 % — ABNORMAL HIGH (ref 11.5–15.5)
WBC Count: 11.4 10*3/uL — ABNORMAL HIGH (ref 4.0–10.5)
nRBC: 0 % (ref 0.0–0.2)

## 2021-06-12 LAB — LACTATE DEHYDROGENASE: LDH: 174 U/L (ref 98–192)

## 2021-06-12 LAB — VITAMIN B12: Vitamin B-12: 277 pg/mL (ref 180–914)

## 2021-06-12 LAB — FOLATE: Folate: 10.5 ng/mL (ref >5.9)

## 2021-06-12 NOTE — Assessment & Plan Note (Signed)
Lab review: ?09/04/2019: Hemoglobin 9.4, hematocrit 30.4, MCV 83, platelets 418, WBC 12.4, creatinine 0.94, normal LFTs ?05/12/2020: Hemoglobin 12.6, hematocrit 85.2, platelets 479 ?04/30/2021: Hemoglobin 10, hematocrit 32.3, MCV 82.6, RDW 13.4, platelets 453, WBC 8.5 ? ?Differential diagnosis: ?1. Anemia due to chronic disease and inflammation ?2. anemia due to renal dysfunction ?3. Combined B-12 and iron deficiency anemias ?4. Hemolysis ?5. Hypothyroidism ?6. Plasma cell disorders myeloma ?7. Bone marrow dysfunction with MDS ? ?Workup performed: ?1. CBC with differential to evaluate the smear ?2. CMP to evaluate liver and kidney function ?3. Haptoglobin, LDH, reticulocyte count to evaluate hemolysis ?4. TSH ?5. SPEP ?6. Iron and 0000000 and folic acid levels ? ?Return to clinic in 3 weeks to discuss results ?

## 2021-06-12 NOTE — Progress Notes (Signed)
[place] ?CONSULT NOTE ? ?Patient Care Team: ?Benito Mccreedy, MD as PCP - General (Internal Medicine) ? ?CHIEF COMPLAINTS/PURPOSE OF CONSULTATION:  ?Iron Deficiency  ? ?HISTORY OF PRESENTING ILLNESS:  ?Meghan Davila 46 y.o. female is here because of recent diagnosis of Iron Deficiency She presents to the clinic today for a consult and labs.  She was noted to have a slowly progressively worsening anemia and she was referred to [place].  She tells me that she has heavy menstrual cycles as result of endometriosis.  She has been feeling drowsy and weak lately. ? ?I reviewed her records extensively and collaborated the history with the patient. ? ?SUMMARY OF ONCOLOGIC HISTORY: ?Oncology History  ? No history exists.  ? ? ? ?MEDICAL HISTORY:  ?Past Medical History:  ?Diagnosis Date  ? GERD (gastroesophageal reflux disease)   ? Gestational diabetes   ? only with pregnancy   ? History of IBS   ? HLD (hyperlipidemia)   ? HTN (hypertension)   ? PIH (pregnancy induced hypertension)   ? ? ?SURGICAL HISTORY: ?Past Surgical History:  ?Procedure Laterality Date  ? BRAIN SURGERY  2001  ? shunt put in, no current problems per pt as of 07/13/19  ? BREAST REDUCTION SURGERY  2008  ? BREAST SURGERY Bilateral 2008  ? reduction  ? CESAREAN SECTION  10/22/09  ? CESAREAN SECTION  2002  ? CHOLECYSTECTOMY  2004  ? COLONOSCOPY    ? EXCISION MASS ABDOMINAL N/A 07/17/2019  ? Procedure: EXCISION OF SUPRAUMBILICAL MASS;  Surgeon: Georganna Skeans, MD;  Location: [facility];  Service: General;  Laterality: N/A;  ? [place] N/A 05/12/2020  ? Procedure: HERNIA REPAIR INCISIONAL WITH MESH, REMOVAL ENDOMETRIOMA;  Surgeon: Georganna Skeans, MD;  Location: [facility];  Service: General;  Laterality: N/A;  [place]  ? LAPAROSCOPIC TUBAL LIGATION Bilateral 10/24/2018  ? Procedure: LAPAROSCOPIC TUBAL LIGATION By Salpingectomy;  Surgeon: Azucena Fallen, MD;  Location[facility];  Service: Gynecology;   Laterality: Bilateral;  ? TUBAL LIGATION    ? UPPER GI ENDOSCOPY    ? ? ?SOCIAL HISTORY: ?Social History  ? ?Socioeconomic History  ? Marital status: Single  ?  Spouse name: Not on file  ? Number of children: Not on file  ? Years of education: Not on file  ? Highest education level: Not on file  ?Occupational History  ? Not on file  ?Tobacco Use  ? Smoking status: Former  ?  Years: 8.00  ?  Types: Cigarettes  ?  Quit date: 12/2017  ?  Years since quitting: 3.4  ? Smokeless tobacco: Never  ?Vaping Use  ? Vaping Use: Never used  ?Substance and Sexual Activity  ? Alcohol use: No  ? Drug use: No  ? Sexual activity: Yes  ?  Partners: Male  ?  Birth control/protection: None  ?Other Topics Concern  ? Not on file  ?Social History Narrative  ? Not on file  ? ?Social Determinants of Health  ? ?Financial Resource Strain: Not on file  ?Food Insecurity: Not on file  ?Transportation Needs: Not on file  ?Physical Activity: Not on file  ?Stress: Not on file  ?Social Connections: Not on file  ?Intimate Partner Violence: Not on file  ? ? ?FAMILY HISTORY: ?Family History  ?Problem Relation Age of Onset  ? Hypertension Mother   ? Hypertension Sister   ? Hypertension Maternal Grandmother   ? Cancer Maternal Grandmother   ?  Hypertension Maternal Grandfather   ? Diabetes Maternal Grandfather   ? ? ?ALLERGIES:  is allergic to penicillins. ? ?MEDICATIONS:  ?Current Outpatient Medications  ?Medication Sig Dispense Refill  ? acetaminophen (TYLENOL) 500 MG tablet Take 500-1,000 mg by mouth every 6 (six) hours as needed for moderate pain or headache.    ? amLODipine (NORVASC) 10 MG tablet Take 1 tablet (10 mg total) by mouth daily. 30 tablet 0  ? doxycycline (VIBRAMYCIN) 100 MG capsule Take 1 capsule (100 mg total) by mouth 2 (two) times daily. 20 capsule 0  ? ibuprofen (ADVIL) 600 MG tablet Take 1 tablet (600 mg total) by mouth every 6 (six) hours as needed. 30 tablet 0  ? losartan-hydrochlorothiazide (HYZAAR) 100-25 MG tablet Take 1 tablet  by mouth daily.    ? ORILISSA 150 MG TABS Take 150 mg by mouth daily.    ? traMADol (ULTRAM) 50 MG tablet Take 1 tablet (50 mg total) by mouth every 12 (twelve) hours as needed. 10 tablet 0  ? ?No current facility-administered medications for this visit.  ? ? ?REVIEW OF SYSTEMS:   ?Constitutional: Denies fevers, chills or abnormal night sweats ?  ?All other systems were reviewed with the patient and are negative. ? ?PHYSICAL EXAMINATION: ?ECOG PERFORMANCE STATUS: 1 - Symptomatic but completely ambulatory ? ?Vitals:  ? 06/12/21 1449  ?BP: (!) 160/105  ?Pulse: 93  ?Resp: 18  ?Temp: 97.9 ?F (36.6 ?C)  ?SpO2: 100%  ? ?Filed Weights  ? 06/12/21 1449  ?Weight: 181 lb (82.1 kg)  ? ? ?GENERAL:alert, no distress and comfortable ?  ? ?LABORATORY DATA:  ?I have reviewed the data as listed ?Lab Results  ?Component Value Date  ? WBC 11.8 (H) 05/12/2020  ? HGB 12.6 05/12/2020  ? HCT 39.0 05/12/2020  ? MCV 85.2 05/12/2020  ? PLT 479 (H) 05/12/2020  ? ?Lab Results  ?Component Value Date  ? NA 137 05/12/2020  ? K 3.5 05/12/2020  ? CL 103 05/12/2020  ? CO2 22 05/12/2020  ? ? ?RADIOGRAPHIC STUDIES: ?I have personally reviewed the radiological reports and agreed with the findings in the report. ? ?ASSESSMENT AND PLAN:  ?Normocytic anemia ?Lab review: ?09/04/2019: Hemoglobin 9.4, hematocrit 30.4, MCV 83, platelets 418, WBC 12.4, creatinine 0.94, normal LFTs ?05/12/2020: Hemoglobin 12.6, hematocrit 85.2, platelets 479 ?04/30/2021: Hemoglobin 10, hematocrit 32.3, MCV 82.6, RDW 13.4, platelets 453, WBC 8.5 ? ?Differential diagnosis: ?1. Anemia due to chronic disease and inflammation ?2. anemia due to renal dysfunction: Patient has normal kidney function ?3. Combined B-12 and iron deficiency anemias ?4. Hemolysis ?5. Hypothyroidism ?6. Plasma cell disorders myeloma ?7. Bone marrow dysfunction with MDS ? ?Workup performed: ?1. CBC with differential to evaluate the smear ?2. CMP to evaluate liver and kidney function ?3. Haptoglobin, LDH,  reticulocyte count to evaluate hemolysis ?4. TSH ?5. SPEP ?6. Iron and 0000000 and folic acid levels ? ?Patient may also have obstructive sleep apnea which makes her extremely tired.  She will discuss with her primary care physician about getting a sleep study. ? ?Patient does have severe bleeding for prolonged period of time secondary to endometriosis. ?Return to clinic in 1 weeks to discuss results ? ? ?All questions were answered. The patient knows to call the clinic with any problems, questions or concerns. ?  ? Harriette Ohara, MD ?06/12/21 ? I Gardiner Coins am scribing for Dr. Lindi Adie ? ?I have reviewed the above documentation for accuracy and completeness, and I agree with the above. ?  ?

## 2021-06-13 LAB — THYROID PANEL WITH TSH
Free Thyroxine Index: 2 (ref 1.2–4.9)
T3 Uptake Ratio: 23 % — ABNORMAL LOW (ref 24–39)
T4, Total: 8.7 ug/dL (ref 4.5–12.0)
TSH: 2.1 u[IU]/mL (ref 0.450–4.500)

## 2021-06-13 LAB — ERYTHROPOIETIN: Erythropoietin: 27.7 m[IU]/mL — ABNORMAL HIGH (ref 2.6–18.5)

## 2021-06-15 LAB — FERRITIN: Ferritin: 18 ng/mL (ref 11–307)

## 2021-06-15 NOTE — Progress Notes (Signed)
HEMATOLOGY-ONCOLOGY TELEPHONE VISIT PROGRESS NOTE ? ?I connected with Meghan Davila on 06/17/21 at  8:45 AM EDT by telephone and verified that I am speaking with the correct person using two identifiers.  ?I discussed the limitations, risks, security and privacy concerns of performing an evaluation and management service by telephone and the availability of in person appointments.  ?I also discussed with the patient that there may be a patient responsible charge related to this service. The patient expressed understanding and agreed to proceed.  ? ?CHIEF COMPLAINTS  Iron Deficiency  ?  ?History of Present Illness: Meghan Davila 46 y.o. female is here because of recent diagnosis of Iron Deficiency She presents to the clinic today via telephone follow-up.    ? ? ?REVIEW OF SYSTEMS:   ?Constitutional: Denies fevers, chills or abnormal weight loss ?All other systems were reviewed with the patient and are negative. ?Observations/Objective:  ? ?  ?Assessment Plan:  ?Normocytic anemia ?09/04/2019: Hemoglobin 9.4, hematocrit 30.4, MCV 83, platelets 418, WBC 12.4, creatinine 0.94, normal LFTs ?05/12/2020: Hemoglobin 12.6, hematocrit 85.2, platelets 479 ?04/30/2021: Hemoglobin 10, hematocrit 32.3, MCV 82.6, RDW 13.4, platelets 453, WBC 8.5 ?06/12/2021: Hemoglobin 9.4, hematocrit 30.1, MCV 80.3, RDW 15.8, platelets 514, TSH 2.1, TIBC 570, iron saturation 13%, LDH 174, folate 10.5, B12 277, ferritin 18, erythropoietin 27.7 ? ?Based on the lab work we can rule out B12 or folate deficiencies, we can rule out hypothyroidism and hemolysis. ?With high TIBC and low iron saturation and low ferritin I recommend that we give intravenous iron therapy. ? ?Return to clinic in 2 months with labs and follow-up day after to discuss the lab results ? ? ? ?I discussed the assessment and treatment plan with the patient. The patient was provided an opportunity to ask questions and all were answered. The patient agreed with the plan and demonstrated an  understanding of the instructions. The patient was advised to call back or seek an in-person evaluation if the symptoms worsen or if the condition fails to improve as anticipated.  ? ?I provided 12 minutes of non-face-to-face time during this encounter. Tamsen Meek, MD   ?Audry Riles am scribing for Dr. Pamelia Hoit ? ?I have reviewed the above documentation for accuracy and completeness, and I agree with the above. ?  ?

## 2021-06-17 ENCOUNTER — Inpatient Hospital Stay: Payer: BC Managed Care – PPO | Attending: Hematology and Oncology | Admitting: Hematology and Oncology

## 2021-06-17 DIAGNOSIS — D509 Iron deficiency anemia, unspecified: Secondary | ICD-10-CM | POA: Insufficient documentation

## 2021-06-17 DIAGNOSIS — D649 Anemia, unspecified: Secondary | ICD-10-CM

## 2021-06-17 LAB — MULTIPLE MYELOMA PANEL, SERUM
Albumin SerPl Elph-Mcnc: 3.7 g/dL (ref 2.9–4.4)
Albumin/Glob SerPl: 1.4 (ref 0.7–1.7)
Alpha 1: 0.2 g/dL (ref 0.0–0.4)
Alpha2 Glob SerPl Elph-Mcnc: 0.6 g/dL (ref 0.4–1.0)
B-Globulin SerPl Elph-Mcnc: 1.2 g/dL (ref 0.7–1.3)
Gamma Glob SerPl Elph-Mcnc: 0.6 g/dL (ref 0.4–1.8)
Globulin, Total: 2.8 g/dL (ref 2.2–3.9)
IgA: 232 mg/dL (ref 87–352)
IgG (Immunoglobin G), Serum: 714 mg/dL (ref 586–1602)
IgM (Immunoglobulin M), Srm: 87 mg/dL (ref 26–217)
Total Protein ELP: 6.5 g/dL (ref 6.0–8.5)

## 2021-06-17 NOTE — Assessment & Plan Note (Signed)
09/04/2019: Hemoglobin 9.4, hematocrit 30.4, MCV 83, platelets 418, WBC 12.4, creatinine 0.94, normal LFTs ?05/12/2020: Hemoglobin 12.6, hematocrit 85.2, platelets 479 ?04/30/2021: Hemoglobin 10, hematocrit 32.3, MCV 82.6, RDW 13.4, platelets 453, WBC 8.5 ?06/12/2021: Hemoglobin 9.4, hematocrit 30.1, MCV 80.3, RDW 15.8, platelets 514, TSH 2.1, TIBC 570, iron saturation 13%, LDH 174, folate 10.5, B12 277, ferritin 18, erythropoietin 27.7 ? ?Based on the lab work we can rule out B12 or folate deficiencies, we can rule out hypothyroidism and hemolysis. ?With high TIBC and low iron saturation and low ferritin I recommend that we give intravenous iron therapy. ? ?Return to clinic in 3 months with labs and follow-up ?

## 2021-06-24 ENCOUNTER — Other Ambulatory Visit: Payer: Self-pay

## 2021-06-24 ENCOUNTER — Inpatient Hospital Stay: Payer: BC Managed Care – PPO

## 2021-06-24 VITALS — BP 144/92 | HR 80 | Temp 98.9°F | Resp 17

## 2021-06-24 DIAGNOSIS — D649 Anemia, unspecified: Secondary | ICD-10-CM

## 2021-06-24 DIAGNOSIS — D509 Iron deficiency anemia, unspecified: Secondary | ICD-10-CM | POA: Diagnosis present

## 2021-06-24 MED ORDER — SODIUM CHLORIDE 0.9 % IV SOLN: Freq: Once | INTRAVENOUS | Status: AC

## 2021-06-24 MED ORDER — SODIUM CHLORIDE 0.9 % IV SOLN
300.0000 mg | Freq: Once | INTRAVENOUS | Status: AC
  Administered 2021-06-24: 300 mg via INTRAVENOUS
  Filled 2021-06-24: qty 300

## 2021-06-24 NOTE — Patient Instructions (Signed)

## 2021-06-25 NOTE — Progress Notes (Deleted)
Patient tolerated IV iron infusion well. Monitored for 30 minute post observation period. VSS, ambulatory to lobby.  

## 2021-07-01 ENCOUNTER — Inpatient Hospital Stay: Payer: BC Managed Care – PPO

## 2021-07-01 ENCOUNTER — Other Ambulatory Visit: Payer: Self-pay

## 2021-07-01 VITALS — BP 136/92 | HR 80 | Temp 98.9°F | Resp 18

## 2021-07-01 DIAGNOSIS — D649 Anemia, unspecified: Secondary | ICD-10-CM

## 2021-07-01 DIAGNOSIS — D509 Iron deficiency anemia, unspecified: Secondary | ICD-10-CM | POA: Diagnosis not present

## 2021-07-01 MED ORDER — SODIUM CHLORIDE 0.9 % IV SOLN
300.0000 mg | Freq: Once | INTRAVENOUS | Status: AC
  Administered 2021-07-01: 300 mg via INTRAVENOUS
  Filled 2021-07-01: qty 300

## 2021-07-01 MED ORDER — SODIUM CHLORIDE 0.9 % IV SOLN: Freq: Once | INTRAVENOUS | Status: AC

## 2021-07-01 NOTE — Patient Instructions (Signed)

## 2021-07-01 NOTE — Progress Notes (Signed)
Patient tolerated IV iron infusion well. Declined to stay for 30 minute post observation period. VSS, ambulatory to lobby.  

## 2021-07-08 ENCOUNTER — Inpatient Hospital Stay: Payer: BC Managed Care – PPO

## 2021-07-08 ENCOUNTER — Other Ambulatory Visit: Payer: Self-pay

## 2021-07-08 VITALS — BP 166/92 | HR 69 | Temp 98.6°F | Resp 17

## 2021-07-08 DIAGNOSIS — D509 Iron deficiency anemia, unspecified: Secondary | ICD-10-CM | POA: Diagnosis not present

## 2021-07-08 DIAGNOSIS — D649 Anemia, unspecified: Secondary | ICD-10-CM

## 2021-07-08 MED ORDER — SODIUM CHLORIDE 0.9 % IV SOLN: Freq: Once | INTRAVENOUS | Status: AC

## 2021-07-08 MED ORDER — SODIUM CHLORIDE 0.9 % IV SOLN
300.0000 mg | Freq: Once | INTRAVENOUS | Status: AC
  Administered 2021-07-08: 300 mg via INTRAVENOUS
  Filled 2021-07-08: qty 300

## 2021-08-18 ENCOUNTER — Inpatient Hospital Stay: Payer: BC Managed Care – PPO | Attending: Hematology and Oncology

## 2021-08-18 ENCOUNTER — Inpatient Hospital Stay: Payer: BC Managed Care – PPO

## 2021-08-18 DIAGNOSIS — D509 Iron deficiency anemia, unspecified: Secondary | ICD-10-CM | POA: Insufficient documentation

## 2021-08-19 ENCOUNTER — Encounter: Payer: Self-pay | Admitting: *Deleted

## 2021-08-19 ENCOUNTER — Telehealth: Payer: Self-pay | Admitting: *Deleted

## 2021-08-19 ENCOUNTER — Inpatient Hospital Stay (HOSPITAL_BASED_OUTPATIENT_CLINIC_OR_DEPARTMENT_OTHER): Payer: BC Managed Care – PPO | Admitting: Adult Health

## 2021-08-19 DIAGNOSIS — Z91199 Patient's noncompliance with other medical treatment and regimen due to unspecified reason: Secondary | ICD-10-CM

## 2021-08-19 NOTE — Progress Notes (Signed)
Kaneshia did not answer the phone when I called her today.  She did not show up for the lab that was scheduled prior to this visit as well.  I requested that my nurse send her a letter about the missed appointment as well as her PCP.  Lillard Anes, NP 08/19/21 11:43 AM Medical Oncology and Hematology [facility] [address] [place], [place] [address] Tel. [phone]    Fax. [phone]

## 2021-08-19 NOTE — Telephone Encounter (Signed)
Contacted patient due to missed/no show appts on 6/6 for LAB & 6/7 w/NP. Per patient, she did not know she was scheduled for those appts. Advised patient that these appts were follow up to determine effectiveness of her recent series of IV Iron infusions and encouraged her to reschedule them. Patient states she wants to reschedule.  Informed her that schedule message sent and that she will be contacted by scheduler.  She verbalized understanding of all information.  Schedule message sent.

## 2021-08-20 ENCOUNTER — Telehealth: Payer: Self-pay | Admitting: Adult Health

## 2021-08-20 NOTE — Telephone Encounter (Signed)
.  Called patient to schedule appointment per 6/7 inbasket, patient is aware of date and time.   

## 2021-08-25 ENCOUNTER — Inpatient Hospital Stay: Payer: BC Managed Care – PPO

## 2021-08-25 ENCOUNTER — Other Ambulatory Visit: Payer: Self-pay

## 2021-08-25 ENCOUNTER — Other Ambulatory Visit: Payer: Self-pay | Admitting: Lab

## 2021-08-25 DIAGNOSIS — D649 Anemia, unspecified: Secondary | ICD-10-CM

## 2021-08-25 DIAGNOSIS — D509 Iron deficiency anemia, unspecified: Secondary | ICD-10-CM | POA: Diagnosis present

## 2021-08-25 LAB — CBC WITH DIFFERENTIAL (CANCER CENTER ONLY)
Abs Immature Granulocytes: 0.05 10*3/uL (ref 0.00–0.07)
Basophils Absolute: 0.1 10*3/uL (ref 0.0–0.1)
Basophils Relative: 1 %
Eosinophils Absolute: 0.2 10*3/uL (ref 0.0–0.5)
Eosinophils Relative: 2 %
HCT: 34.5 % — ABNORMAL LOW (ref 36.0–46.0)
Hemoglobin: 11.1 g/dL — ABNORMAL LOW (ref 12.0–15.0)
Immature Granulocytes: 0 %
Lymphocytes Relative: 17 %
Lymphs Abs: 2.2 10*3/uL (ref 0.7–4.0)
MCH: 26.7 pg (ref 26.0–34.0)
MCHC: 32.2 g/dL (ref 30.0–36.0)
MCV: 82.9 fL (ref 80.0–100.0)
Monocytes Absolute: 0.7 10*3/uL (ref 0.1–1.0)
Monocytes Relative: 6 %
Neutro Abs: 9.7 10*3/uL — ABNORMAL HIGH (ref 1.7–7.7)
Neutrophils Relative %: 74 %
Platelet Count: 425 10*3/uL — ABNORMAL HIGH (ref 150–400)
RBC: 4.16 MIL/uL (ref 3.87–5.11)
RDW: 17.6 % — ABNORMAL HIGH (ref 11.5–15.5)
WBC Count: 13 10*3/uL — ABNORMAL HIGH (ref 4.0–10.5)
nRBC: 0 % (ref 0.0–0.2)

## 2021-08-25 LAB — IRON AND IRON BINDING CAPACITY (CC-WL,HP ONLY)
Iron: 52 ug/dL (ref 28–170)
Saturation Ratios: 13 % (ref 10.4–31.8)
TIBC: 396 ug/dL (ref 250–450)
UIBC: 344 ug/dL (ref 148–442)

## 2021-08-25 LAB — FERRITIN: Ferritin: 132 ng/mL (ref 11–307)

## 2021-08-26 ENCOUNTER — Inpatient Hospital Stay (HOSPITAL_BASED_OUTPATIENT_CLINIC_OR_DEPARTMENT_OTHER): Payer: BC Managed Care – PPO | Admitting: Adult Health

## 2021-08-26 ENCOUNTER — Telehealth: Payer: Self-pay | Admitting: Adult Health

## 2021-08-26 DIAGNOSIS — R5383 Other fatigue: Secondary | ICD-10-CM | POA: Diagnosis not present

## 2021-08-26 DIAGNOSIS — D649 Anemia, unspecified: Secondary | ICD-10-CM | POA: Diagnosis not present

## 2021-08-26 NOTE — Telephone Encounter (Signed)
Scheduled appointment per 6/14 los. Patient is aware. Patient will be mailed an updated calendar.

## 2021-08-26 NOTE — Assessment & Plan Note (Signed)
Saba is a 46 year old woman with normocytic anemia later diagnosed as iron deficiency, status post 3 doses of IV iron.  Fabianna tolerated the IV iron well.  Although her ferritin levels have improved she does have a couple other lab abnormalities including a slightly elevated white blood cell count and slightly elevated platelet count.  These could certainly be related to sleep apnea.  I placed a pulmonary consult for sleep evaluation for her today.  We discussed that her heavy cycles are the cause of her iron deficiency and I recommended that she undergo gynecology evaluation.  The only way to stop the iron deficiency is to stop the underlying cause, so we really need to get her long heavy cycles under better control.  Taimane verbalized understanding and reviewed with me that she does plan on getting her appointment with gynecology.  Lachon will return in 3 months for labs and then follow-up with Dr. Lindi Adie.

## 2021-08-26 NOTE — Progress Notes (Signed)
[facility] Cancer Follow up:    Jackie Plum, MD [address] [place] [address]  I connected with Charday Capetillo on 08/26/21 at 11:45 AM EDT by telephone and verified that I am speaking with the correct person using two identifiers.  I discussed the limitations, risks, security and privacy concerns of performing an evaluation and management service by telephone and the availability of in person appointments.  I also discussed with the patient that there may be a patient responsible charge related to this service. The patient expressed understanding and agreed to proceed.   Patient location: home in [place], [place] Provider location: [facility] office DIAGNOSIS: NORMOCYTIC ANEMIA  SUMMARY OF HEMATOLOGIC HISTORY: 09/04/2019: Hemoglobin 9.4, hematocrit 30.4, MCV 83, platelets 418, WBC 12.4, creatinine 0.94, normal LFTs  05/12/2020: Hemoglobin 12.6, hematocrit 85.2, platelets 479 04/30/2021: Hemoglobin 10, hematocrit 32.3, MCV 82.6, RDW 13.4, platelets 453, WBC 8.5  06/12/2021: Hemoglobin 9.4, hematocrit 30.1, MCV 80.3, RDW 15.8, platelets 514, TSH 2.1, TIBC 570, iron saturation 13%, LDH 174, folate 10.5, B12 277, ferritin 18, erythropoietin 27.7 Diagnosed with Iron deficiency and Venofer 300mg  IV x 3 was given beginning 06/24/2021 Ferritin on 08/25/2021 132 (up from 18)  CURRENT THERAPY: s/p IV iron  INTERVAL HISTORY: 08/27/2021 46 y.o. female returns for follow-up of her iron deficiency.  She is status post IV Venofer 300 mg given on April 12 and then weekly for 2 additional times totaling 3 treatments.  She tolerates that well and tells me that her tiredness is somewhat improved but not tremendously.  She also notes that she was suggested to undergo sleep apnea testing and is unsure where to have this completed.    I discussed with Amarri that her iron deficiency improved with IV iron and that the only way to lose iron is to bleed.  She tells me she has endometriosis  and has very heavy cycles.  She has not seen gynecology but does plan to get an initial consultative appointment in the next week or 2.   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  MEDICAL HISTORY: Past Medical History:  Diagnosis Date   GERD (gastroesophageal reflux disease)    Gestational diabetes    only with pregnancy    History of IBS    HLD (hyperlipidemia)    HTN (hypertension)    PIH (pregnancy induced hypertension)     SURGICAL HISTORY: Past Surgical History:  Procedure Laterality Date   BRAIN SURGERY  2001   shunt put in, no current problems per pt as of 07/13/19   BREAST REDUCTION SURGERY  2008   BREAST SURGERY Bilateral 2008   reduction   CESAREAN SECTION  10/22/09   CESAREAN SECTION  2002   CHOLECYSTECTOMY  2004   COLONOSCOPY     EXCISION MASS ABDOMINAL N/A 07/17/2019   Procedure: EXCISION OF SUPRAUMBILICAL MASS;  Surgeon: 09/16/2019, MD;  Location: [facility] OR;  Service: General;  Laterality: N/A;   INCISIONAL HERNIA REPAIR N/A 05/12/2020   Procedure: HERNIA REPAIR INCISIONAL WITH MESH, REMOVAL ENDOMETRIOMA;  Surgeon: 05/14/2020, MD;  Location: [facility] OR;  Service: General;  Laterality: N/A;  60 MIN TOTAL -  IQUEUE TIME IS HELD   LAPAROSCOPIC TUBAL LIGATION Bilateral 10/24/2018   Procedure: LAPAROSCOPIC TUBAL LIGATION By Salpingectomy;  Surgeon: Shea Evans, MD;  Location: [facility];  Service: Gynecology;  Laterality: Bilateral;   TUBAL LIGATION     UPPER GI ENDOSCOPY      SOCIAL HISTORY: Social History   Socioeconomic History   Marital status: Single    Spouse name: Not on file   Number of children: Not on  file   Years of education: Not on file   Highest education level: Not on file  Occupational History   Not on file  Tobacco Use   Smoking status: Former    Years: 8.00    Types: Cigarettes    Quit date: 12/2017    Years since quitting: 3.7   Smokeless tobacco: Never  Vaping Use   Vaping Use: Never used  Substance and Sexual Activity   Alcohol use: No   Drug use: No   Sexual activity: Yes    Partners: Male    Birth control/protection: None  Other Topics Concern   Not on file  Social History Narrative   Not on file   Social Determinants of Health   Financial Resource Strain: Not on file  Food Insecurity: Not on file  Transportation Needs: Not on file  Physical Activity: Not on file  Stress: Not on file  Social Connections: Not on file  Intimate Partner Violence: Not on file    FAMILY HISTORY: Family History  Problem Relation Age of Onset   Hypertension Mother    Hypertension Sister    Hypertension Maternal Grandmother    Cancer Maternal Grandmother    Hypertension Maternal Grandfather    Diabetes Maternal Grandfather     Review of Systems  Constitutional:  Positive for fatigue. Negative for appetite change, chills, fever and unexpected weight change.  HENT:   Negative for hearing loss, lump/mass and trouble swallowing.   Eyes:  Negative for eye problems and icterus.  Respiratory:  Negative for chest tightness, cough and shortness of breath.   Cardiovascular:  Negative for chest pain, leg swelling and palpitations.  Gastrointestinal:  Negative for abdominal distention, abdominal pain, constipation, diarrhea, nausea and vomiting.  Endocrine: Negative for hot flashes.  Genitourinary:  Negative for difficulty urinating.   Musculoskeletal:  Negative for arthralgias.  Skin:  Negative for itching and rash.  Neurological:  Negative for dizziness, extremity weakness, headaches and numbness.  Hematological:  Negative for adenopathy. Does not bruise/bleed easily.   Psychiatric/Behavioral:  Negative for depression. The patient is not nervous/anxious.       PHYSICAL EXAMINATION This was a telephone visit.  Tiffiny sounds well, mood and behavior were normal, breathing sounds unlabored.  Speech is normal.      LABORATORY DATA:  CBC    Component Value Date/Time   WBC 13.0 (H) 08/25/2021 1101   WBC 11.8 (H) 05/12/2020 0858   RBC 4.16 08/25/2021 1101   HGB 11.1 (L) 08/25/2021 1101   HCT 34.5 (L) 08/25/2021 1101   PLT 425 (H) 08/25/2021 1101   MCV 82.9 08/25/2021 1101   MCH 26.7 08/25/2021 1101   MCHC 32.2 08/25/2021 1101   RDW 17.6 (H) 08/25/2021 1101   LYMPHSABS 2.2 08/25/2021 1101   MONOABS 0.7 08/25/2021 1101   EOSABS 0.2 08/25/2021 1101   BASOSABS 0.1 08/25/2021 1101    CMP     Component Value Date/Time   NA 137 05/12/2020 0858   K 3.5 05/12/2020 0858   CL  103 05/12/2020 0858   CO2 22 05/12/2020 0858   GLUCOSE 87 05/12/2020 0858   BUN 13 05/12/2020 0858   CREATININE 0.77 05/12/2020 0858   CALCIUM 9.7 05/12/2020 0858   PROT 7.1 01/11/2018 0955   ALBUMIN 4.0 01/11/2018 0955   AST 17 01/11/2018 0955   ALT 21 01/11/2018 0955   ALKPHOS 53 01/11/2018 0955   BILITOT 0.8 01/11/2018 0955   GFRNONAA >60 05/12/2020 0858   GFRAA >60 07/13/2019 1055      ASSESSMENT and THERAPY PLAN:   Normocytic anemia Leiani is a 46 year old woman with normocytic anemia later diagnosed as iron deficiency, status post 3 doses of IV iron.  Emmett tolerated the IV iron well.  Although her ferritin levels have improved she does have a couple other lab abnormalities including a slightly elevated white blood cell count and slightly elevated platelet count.  These could certainly be related to sleep apnea.  I placed a pulmonary consult for sleep evaluation for her today.  We discussed that her heavy cycles are the cause of her iron deficiency and I recommended that she undergo gynecology evaluation.  The only way to stop the iron deficiency is to  stop the underlying cause, so we really need to get her long heavy cycles under better control.  Rashel verbalized understanding and reviewed with me that she does plan on getting her appointment with gynecology.  Elmarie Shileyiffany will return in 3 months for labs and then follow-up with Dr. Pamelia HoitGudena.     All questions were answered. The patient knows to call the clinic with any problems, questions or concerns. We can certainly see the patient much sooner if necessary. Follow up instructions:    -Return to cancer center in 3 months as noted above -Follow-up with gynecology   The patient was provided an opportunity to ask questions and all were answered. The patient agreed with the plan and demonstrated an understanding of the instructions.   The patient was advised to call back or seek an in-person evaluation if the symptoms worsen or if the condition fails to improve as anticipated.   I provided 11 minutes of non face-to-face telephone visit time during this encounter, and > 50% was spent counseling as documented under my assessment & plan.  Lillard AnesLindsey Brodi Kari, NP 08/26/21 12:18 PM Medical Oncology and Hematology [facility] [address] [place], [place] [address] Tel. [phone]    Fax. [phone]

## 2021-11-17 ENCOUNTER — Other Ambulatory Visit: Payer: Self-pay | Admitting: *Deleted

## 2021-11-17 DIAGNOSIS — D649 Anemia, unspecified: Secondary | ICD-10-CM

## 2021-11-18 ENCOUNTER — Inpatient Hospital Stay: Payer: BC Managed Care – PPO | Attending: Hematology and Oncology

## 2021-11-19 ENCOUNTER — Inpatient Hospital Stay: Payer: BC Managed Care – PPO | Admitting: Hematology and Oncology

## 2021-11-19 ENCOUNTER — Telehealth: Payer: Self-pay | Admitting: Hematology and Oncology

## 2021-11-19 NOTE — Telephone Encounter (Signed)
Scheduled per 9/7 in basket pt has been called and confirmed

## 2021-11-19 NOTE — Assessment & Plan Note (Signed)
09/04/2019: Hemoglobin 9.4, hematocrit 30.4, MCV 83, platelets 418, WBC 12.4, creatinine 0.94, normal LFTs 05/12/2020: Hemoglobin 12.6, hematocrit 85.2, platelets 479 04/30/2021: Hemoglobin 10, hematocrit 32.3, MCV 82.6, RDW 13.4, platelets 453, WBC 8.5 06/12/2021: Hemoglobin 9.4, hematocrit 30.1, MCV 80.3, RDW 15.8, platelets 514, TSH 2.1, TIBC 570, iron saturation 13%, LDH 174, folate 10.5, B12 277, ferritin 18, erythropoietin 27.7 08/25/2021: Hemoglobin 11.1, platelets 425  IV iron: April 2023

## 2021-12-16 NOTE — Progress Notes (Signed)
I called the patient multiple times but she did not respond. Her mailbox is full and cannot accept any messages either. If she calls [place] back then we will need to schedule her for labs and then a phone follow-up with me.

## 2021-12-18 ENCOUNTER — Inpatient Hospital Stay: Payer: BC Managed Care – PPO | Attending: Hematology and Oncology

## 2021-12-21 ENCOUNTER — Inpatient Hospital Stay (HOSPITAL_BASED_OUTPATIENT_CLINIC_OR_DEPARTMENT_OTHER): Payer: BC Managed Care – PPO | Admitting: Hematology and Oncology

## 2021-12-21 DIAGNOSIS — D649 Anemia, unspecified: Secondary | ICD-10-CM

## 2021-12-21 NOTE — Assessment & Plan Note (Signed)
09/04/2019: Hemoglobin 9.4, hematocrit 30.4, MCV 83, platelets 418, WBC 12.4, creatinine 0.94, normal LFTs 05/12/2020: Hemoglobin 12.6, hematocrit 85.2, platelets 479 04/30/2021: Hemoglobin 10, hematocrit 32.3, MCV 82.6, RDW 13.4, platelets 453, WBC 8.5 06/12/2021: Hemoglobin 9.4, hematocrit 30.1, MCV 80.3, RDW 15.8, platelets 514, TSH 2.1, TIBC 570, iron saturation 13%, LDH 174, folate 10.5, B12 277, ferritin 18, erythropoietin 27.7  IV iron: April 2023   Return to clinic in 2 months with labs and follow-up day after to discuss the lab results

## 2022-10-13 ENCOUNTER — Encounter: Payer: Self-pay | Admitting: Hematology and Oncology

## 2022-10-13 NOTE — Progress Notes (Signed)
error    This encounter was created in error - please disregard.

## 2022-10-13 NOTE — Progress Notes (Deleted)
This encounter was created in error - please disregard.

## 2022-12-30 ENCOUNTER — Ambulatory Visit: Payer: BC Managed Care – PPO | Admitting: Adult Health

## 2022-12-30 ENCOUNTER — Encounter: Payer: Self-pay | Admitting: Adult Health

## 2022-12-30 VITALS — BP 140/100 | HR 80 | Ht 61.0 in | Wt 178.0 lb

## 2022-12-30 DIAGNOSIS — R0683 Snoring: Secondary | ICD-10-CM | POA: Insufficient documentation

## 2022-12-30 NOTE — Patient Instructions (Signed)
Set up for home sleep study  ?Healthy sleep regimen  ?Do not drive if sleepy .  ?Follow up in 6 weeks to discuss results and treatment plan .  ?

## 2022-12-30 NOTE — Progress Notes (Signed)
@Patient  ID: Meghan Davila, female    DOB: Dec 04, 1975, 47 y.o.   MRN: [id number]  Chief Complaint  Patient presents with   Consult    Referring provider: Jackie Plum, MD  HPI: 47 year old female seen for sleep consult December 30, 2022 for snoring, restless sleep and daytime sleepiness  TEST/EVENTS :   12/30/2022 Sleep consult  Patient presents for sleep consult today for snoring, restless sleep and daytime sleepiness.  Kindly referred by primary care provider Jackie Plum, MD. patient complains of restless sleep.  Very loud snoring.  Has had family members witness apneic events while sleeping.  Typically goes to bed about 8 PM.  Takes only 15 minutes to go to sleep.  Is up several times throughout the night.  Gets up at 4 AM.  Works dayshift doing housekeeping.  Has never had a sleep study before.  Typically naps twice a day.  For at least 1 hour each time.  Does not use any sleep aids.  No history of congestive heart failure or stroke.  No removable dental work.  1 cup of caffeine daily.  Epworth score is 17 out of 24.  Typically gets sleepy if she sits down to watch TV, rest, in the evening hours and after eating lunch.  Also while passenger of a car and while driving.  Medical history significant for hypertension, hyperlipidemia, GERD.  Past Surgical History:  Procedure Laterality Date   BRAIN SURGERY  2001   shunt put in, no current problems per pt as of 07/13/19   BREAST REDUCTION SURGERY  2008   BREAST SURGERY Bilateral 2008   reduction   CESAREAN SECTION  10/22/09   CESAREAN SECTION  2002   CHOLECYSTECTOMY  2004   COLONOSCOPY     EXCISION MASS ABDOMINAL N/A 07/17/2019   Procedure: EXCISION OF SUPRAUMBILICAL MASS;  Surgeon: Violeta Gelinas, MD;  Location: [facility];  Service: General;  Laterality: N/A;   INCISIONAL HERNIA REPAIR N/A 05/12/2020   Procedure: HERNIA REPAIR INCISIONAL WITH MESH, REMOVAL ENDOMETRIOMA;  Surgeon: Violeta Gelinas, MD;  Location: [facility] OR;   Service: General;  Laterality: N/A;  60 MIN TOTAL - IQUEUE TIME IS HELD   LAPAROSCOPIC TUBAL LIGATION Bilateral 10/24/2018   Procedure: LAPAROSCOPIC TUBAL LIGATION By Salpingectomy;  Surgeon: Shea Evans, MD;  Location: [facility];  Service: Gynecology;  Laterality: Bilateral;   TUBAL LIGATION     UPPER GI ENDOSCOPY       Social history patient is married.  Works full-time as a Advertising copywriter.  She is a former smoker.  Quit in 2019.  No alcohol or drug use.  She has 2 children.  Family history positive for sleep apnea in her sister who is on CPAP.   Allergies  Allergen Reactions   Penicillins Rash    Immunization History  Administered Date(s) Administered   PFIZER(Purple Top)SARS-COV-2 Vaccination 11/15/2019, 12/06/2019    Past Medical History:  Diagnosis Date   GERD (gastroesophageal reflux disease)    Gestational diabetes    only with pregnancy    History of IBS    HLD (hyperlipidemia)    HTN (hypertension)    PIH (pregnancy induced hypertension)     Tobacco History: Social History   Tobacco Use  Smoking Status Former   Current packs/day: 0.00   Types: Cigarettes   Start date: 12/2009   Quit date: 12/2017   Years since quitting: 5.0  Smokeless Tobacco Never   Counseling given: Not Answered   Outpatient Medications Prior to Visit  Medication Sig Dispense Refill   acetaminophen (TYLENOL) 500 MG tablet Take 500-1,000 mg by mouth every 6 (six) hours as needed for moderate pain or headache.     amLODipine (NORVASC) 10 MG tablet Take 1 tablet (10 mg total) by mouth daily. 30 tablet 0   atorvastatin (LIPITOR) 20 MG tablet Take 1 tablet by mouth daily.     ibuprofen (ADVIL) 600 MG tablet Take 1 tablet (600 mg total) by mouth every 6 (six) hours as needed. 30 tablet 0   losartan-hydrochlorothiazide (HYZAAR) 100-25 MG tablet Take 1 tablet by mouth daily.     omeprazole (PRILOSEC) 20 MG capsule Take 20 mg by mouth 2 (two) times daily.     doxycycline  (VIBRAMYCIN) 100 MG capsule Take 1 capsule (100 mg total) by mouth 2 (two) times daily. (Patient not taking: Reported on 12/30/2022) 20 capsule 0   ORILISSA 150 MG TABS Take 150 mg by mouth daily. (Patient not taking: Reported on 12/30/2022)     traMADol (ULTRAM) 50 MG tablet Take 1 tablet (50 mg total) by mouth every 12 (twelve) hours as needed. (Patient not taking: Reported on 12/30/2022) 10 tablet 0   No facility-administered medications prior to visit.     Review of Systems:   Constitutional:   No  weight loss, night sweats,  Fevers, chills, +fatigue, or  lassitude.  HEENT:   No headaches,  Difficulty swallowing,  Tooth/dental problems, or  Sore throat,                No sneezing, itching, ear ache, nasal congestion, post nasal drip,   CV:  No chest pain,  Orthopnea, PND, swelling in lower extremities, anasarca, dizziness, palpitations, syncope.   GI  No heartburn, indigestion, abdominal pain, nausea, vomiting, diarrhea, change in bowel habits, loss of appetite, bloody stools.   Resp: No shortness of breath with exertion or at rest.  No excess mucus, no productive cough,  No non-productive cough,  No coughing up of blood.  No change in color of mucus.  No wheezing.  No chest wall deformity  Skin: no rash or lesions.  GU: no dysuria, change in color of urine, no urgency or frequency.  No flank pain, no hematuria   MS:  No joint pain or swelling.  No decreased range of motion.  No back pain.    Physical Exam  BP (!) 140/100 (BP Location: Left Arm, Patient Position: Sitting, Cuff Size: Large)   Pulse 80   Ht 5\' 1"  (1.549 m)   Wt 178 lb (80.7 kg)   SpO2 100%   BMI 33.63 kg/m   GEN: A/Ox3; pleasant , NAD, well nourished    HEENT:  [place]/AT,   NOSE-clear, THROAT-clear, no lesions, no postnasal drip or exudate noted. Class 2-3 MP airway   NECK:  Supple w/ fair ROM; no JVD; normal carotid impulses w/o bruits; no thyromegaly or nodules palpated; no lymphadenopathy.    RESP  Clear   P & A; w/o, wheezes/ rales/ or rhonchi. no accessory muscle use, no dullness to percussion  CARD:  RRR, no m/r/g, no peripheral edema, pulses intact, no cyanosis or clubbing.  GI:   Soft & nt; nml bowel sounds; no organomegaly or masses detected.   Musco: Warm bil, no deformities or joint swelling noted.   Neuro: alert, no focal deficits noted.    Skin: Warm, no lesions or rashes    Lab Results:  CBC    BNP   ProBNP No results found for: "PROBNP"  Imaging: No results found.  Administration History     None           No data to display          No results found for: "NITRICOXIDE"      Assessment & Plan:   No problem-specific Assessment & Plan notes found for this encounter.     Rubye Oaks, NP 12/30/2022

## 2022-12-30 NOTE — Addendum Note (Signed)
Addended by: Delrae Rend on: 12/30/2022 11:45 AM   Modules accepted: Orders

## 2022-12-30 NOTE — Assessment & Plan Note (Signed)
Loud snoring, restless sleep, daytime sleepiness all concerning for underlying sleep apnea.  Set patient up for home sleep study.  Patient education given on sleep apnea  - discussed how weight can impact sleep and risk for sleep disordered breathing - discussed options to assist with weight loss: combination of diet modification, cardiovascular and strength training exercises   - had an extensive discussion regarding the adverse health consequences related to untreated sleep disordered breathing - specifically discussed the risks for hypertension, coronary artery disease, cardiac dysrhythmias, cerebrovascular disease, and diabetes - lifestyle modification discussed   - discussed how sleep disruption can increase risk of accidents, particularly when driving - safe driving practices were discussed   Plan  Patient Instructions  Set up for home sleep study.  Healthy sleep regimen  Do not drive if sleepy  Follow up in 6 weeks to discuss results and treatment plan

## 2023-01-10 DIAGNOSIS — R0683 Snoring: Secondary | ICD-10-CM

## 2023-02-11 ENCOUNTER — Telehealth: Payer: Self-pay | Admitting: Pulmonary Disease

## 2023-02-11 NOTE — Telephone Encounter (Signed)
Call patient  Sleep study result  Date of study: 1975/06/05  Impression: Severe obstructive sleep apnea with moderate oxygen desaturations  Recommendation: DME referral  Recommend CPAP therapy for severe obstructive sleep apnea  Auto titrating CPAP with pressure settings of 5-20 will be appropriate  Encourage weight loss measures  Follow-up in the office 4 to 6 weeks following initiation of treatment

## 2023-02-14 NOTE — Telephone Encounter (Signed)
Attempted call. No answer. Mailbox is full. Will call again.

## 2023-02-14 NOTE — Telephone Encounter (Signed)
Sleep study shows severe OSA Please make ov to discuss results and treatment  Can double book this week virtual or in person

## 2023-02-15 NOTE — Telephone Encounter (Signed)
NA. Mailbox is full. Will call again.

## 2023-02-18 NOTE — Telephone Encounter (Signed)
Patient advised. Appt scheduled. Nothing further needed.

## 2023-02-23 ENCOUNTER — Ambulatory Visit (INDEPENDENT_AMBULATORY_CARE_PROVIDER_SITE_OTHER): Payer: BC Managed Care – PPO | Admitting: Adult Health

## 2023-02-23 ENCOUNTER — Encounter: Payer: Self-pay | Admitting: Adult Health

## 2023-02-23 VITALS — BP 152/118 | HR 83

## 2023-02-23 DIAGNOSIS — I10 Essential (primary) hypertension: Secondary | ICD-10-CM | POA: Diagnosis not present

## 2023-02-23 DIAGNOSIS — G4733 Obstructive sleep apnea (adult) (pediatric): Secondary | ICD-10-CM | POA: Insufficient documentation

## 2023-02-23 HISTORY — DX: Obstructive sleep apnea (adult) (pediatric): G47.33

## 2023-02-23 NOTE — Assessment & Plan Note (Signed)
Severe OSA-discussed Sleep study results. Patient education given  - discussed how weight can impact sleep and risk for sleep disordered breathing - discussed options to assist with weight loss: combination of diet modification, cardiovascular and strength training exercises   - had an extensive discussion regarding the adverse health consequences related to untreated sleep disordered breathing - specifically discussed the risks for hypertension, coronary artery disease, cardiac dysrhythmias, cerebrovascular disease, and diabetes - lifestyle modification discussed   - discussed how sleep disruption can increase risk of accidents, particularly when driving - safe driving practices were discussed   Plan  Patient Instructions  Begin CPAP therapy at bedtime, wear all night long for at least 6 or more hours Work on healthy weight loss Do not drive if sleepy Follow up in 3 months and as needed

## 2023-02-23 NOTE — Patient Instructions (Signed)
Begin CPAP therapy at bedtime, wear all night long for at least 6 or more hours Work on healthy weight loss Do not drive if sleepy Follow up in 3 months and as needed

## 2023-02-23 NOTE — Progress Notes (Signed)
@Patient  ID: Meghan Davila, female    DOB: [birth year], 47 y.o.   MRN: [id number]  Chief Complaint  Patient presents with   Follow-up    Referring provider: Jackie Plum, MD  HPI: 47 year old female seen for sleep consult December 30, 2022 for snoring and daytime sleepiness found to have severe obstructive sleep apnea  TEST/EVENTS :   02/23/2023 Follow up : OSA  Patient returns for a 3-month follow-up.  Patient was seen last visit for a sleep consult for snoring and daytime sleepiness. She was set up for home sleep study. Home sleep study done January 10, 2023 showed severe sleep apnea with AHI at 32.8/hour and SpO2 low at 75%. We discussed her sleep study results in detail.  Went over treatment options.  She would like to proceed with CPAP therapy Patient has significant symptom burden.  Feels tired all the time.  Just wants to feel better and sleep better Blood pressure was elevated today.  She denies any headache, visual changes, extremity weakness.  Says she did take her medications right before coming in today.  Allergies  Allergen Reactions   Penicillins Rash    Immunization History  Administered Date(s) Administered   PFIZER(Purple Top)SARS-COV-2 Vaccination 11/15/2019, 12/06/2019    Past Medical History:  Diagnosis Date   GERD (gastroesophageal reflux disease)    Gestational diabetes    only with pregnancy    History of IBS    HLD (hyperlipidemia)    HTN (hypertension)    OSA (obstructive sleep apnea) 02/23/2023   PIH (pregnancy induced hypertension)     Tobacco History: Social History   Tobacco Use  Smoking Status Former   Current packs/day: 0.00   Types: Cigarettes   Start date: 12/2009   Quit date: 12/2017   Years since quitting: 5.2  Smokeless Tobacco Never   Counseling given: Not Answered   Outpatient Medications Prior to Visit  Medication Sig Dispense Refill   acetaminophen (TYLENOL) 500 MG tablet Take 500-1,000 mg by mouth every 6  (six) hours as needed for moderate pain or headache.     amLODipine (NORVASC) 10 MG tablet Take 1 tablet (10 mg total) by mouth daily. 30 tablet 0   atorvastatin (LIPITOR) 20 MG tablet Take 1 tablet by mouth daily.     ibuprofen (ADVIL) 600 MG tablet Take 1 tablet (600 mg total) by mouth every 6 (six) hours as needed. 30 tablet 0   losartan-hydrochlorothiazide (HYZAAR) 100-25 MG tablet Take 1 tablet by mouth daily.     omeprazole (PRILOSEC) 20 MG capsule Take 20 mg by mouth 2 (two) times daily.     No facility-administered medications prior to visit.     Review of Systems:   Constitutional:   No  weight loss, night sweats,  Fevers, chills, +fatigue, or  lassitude.  HEENT:   No headaches,  Difficulty swallowing,  Tooth/dental problems, or  Sore throat,                No sneezing, itching, ear ache, nasal congestion, post nasal drip,   CV:  No chest pain,  Orthopnea, PND, swelling in lower extremities, anasarca, dizziness, palpitations, syncope.   GI  No heartburn, indigestion, abdominal pain, nausea, vomiting, diarrhea, change in bowel habits, loss of appetite, bloody stools.   Resp: No shortness of breath with exertion or at rest.  No excess mucus, no productive cough,  No non-productive cough,  No coughing up of blood.  No change in color of mucus.  No wheezing.  No chest wall deformity  Skin: no rash or lesions.  GU: no dysuria, change in color of urine, no urgency or frequency.  No flank pain, no hematuria   MS:  No joint pain or swelling.  No decreased range of motion.  No back pain.    Physical Exam  BP (!) 152/118 (BP Location: Left Arm, Cuff Size: Large)   Pulse 83   SpO2 100%   GEN: A/Ox3; pleasant , NAD, well nourished    HEENT:  [place]/AT,, NOSE-clear, THROAT-clear, no lesions, no postnasal drip or exudate noted.   NECK:  Supple w/ fair ROM; no JVD; normal carotid impulses w/o bruits; no thyromegaly or nodules palpated; no lymphadenopathy.    RESP  Clear  P & A; w/o,  wheezes/ rales/ or rhonchi. no accessory muscle use, no dullness to percussion  CARD:  RRR, no m/r/g, no peripheral edema, pulses intact, no cyanosis or clubbing.  GI:   Soft & nt; nml bowel sounds; no organomegaly or masses detected.   Musco: Warm bil, no deformities or joint swelling noted.   Neuro: alert, no focal deficits noted.    Skin: Warm, no lesions or rashes    Lab Results:    BMET   BNP    Component Value Date/Time   BNP 68.9 01/11/2018 0955    ProBNP No results found for: "PROBNP"  Imaging: No results found.  Administration History     None           No data to display          No results found for: "NITRICOXIDE"      Assessment & Plan:   OSA (obstructive sleep apnea) Severe OSA-discussed Sleep study results. Patient education given  - discussed how weight can impact sleep and risk for sleep disordered breathing - discussed options to assist with weight loss: combination of diet modification, cardiovascular and strength training exercises   - had an extensive discussion regarding the adverse health consequences related to untreated sleep disordered breathing - specifically discussed the risks for hypertension, coronary artery disease, cardiac dysrhythmias, cerebrovascular disease, and diabetes - lifestyle modification discussed   - discussed how sleep disruption can increase risk of accidents, particularly when driving - safe driving practices were discussed   Plan  Patient Instructions  Begin CPAP therapy at bedtime, wear all night long for at least 6 or more hours Work on healthy weight loss Do not drive if sleepy Follow up in 3 months and as needed    Hypertension Blood pressure is elevated. Asymptomatic, exam unrevealing.  Advised to follow up with PCP .      Rubye Oaks, NP 02/23/2023

## 2023-02-23 NOTE — Assessment & Plan Note (Signed)
Blood pressure is elevated. Asymptomatic, exam unrevealing.  Advised to follow up with PCP .

## 2023-03-14 ENCOUNTER — Encounter: Payer: Self-pay | Admitting: Hematology and Oncology

## 2023-04-06 ENCOUNTER — Ambulatory Visit: Payer: BC Managed Care – PPO | Admitting: Adult Health

## 2023-05-23 ENCOUNTER — Encounter: Payer: Self-pay | Admitting: Hematology and Oncology

## 2023-05-23 ENCOUNTER — Ambulatory Visit: Payer: BC Managed Care – PPO | Admitting: Adult Health

## 2023-05-23 ENCOUNTER — Encounter: Payer: Self-pay | Admitting: Adult Health

## 2023-05-24 ENCOUNTER — Ambulatory Visit: Payer: BC Managed Care – PPO | Admitting: Adult Health

## 2023-07-11 ENCOUNTER — Encounter: Payer: Self-pay | Admitting: Adult Health

## 2023-07-11 ENCOUNTER — Ambulatory Visit: Admitting: Adult Health

## 2023-07-11 VITALS — BP 160/110 | HR 94 | Ht 61.0 in | Wt 185.0 lb

## 2023-07-11 DIAGNOSIS — Z6834 Body mass index (BMI) 34.0-34.9, adult: Secondary | ICD-10-CM | POA: Diagnosis not present

## 2023-07-11 DIAGNOSIS — G4733 Obstructive sleep apnea (adult) (pediatric): Secondary | ICD-10-CM | POA: Diagnosis not present

## 2023-07-11 NOTE — Progress Notes (Unsigned)
 @Patient  ID: Meghan Davila, female    DOB: 1975-03-24, 48 y.o.   MRN: [id number]  Chief Complaint  Patient presents with   Follow-up    Referring provider: Tretha Fu, MD  HPI: 48 year old female seen for sleep consult October 2024 for snoring daytime sleepiness found to have severe obstructive sleep apnea Medical history significant for hypertension  TEST/EVENTS :  Home sleep study done January 10, 2023 showed severe sleep apnea with AHI at 32.8/hour and SpO2 low at 75%.   07/11/2023 Follow up : OSA  Patient presents for a 65-month follow-up.  Patient was seen last visit after recent home sleep study showed severe obstructive sleep apnea.  She was started on CPAP therapy.  Since last visit patient  Doing good   Allergies  Allergen Reactions   Penicillins Rash    Immunization History  Administered Date(s) Administered   PFIZER(Purple Top)SARS-COV-2 Vaccination 11/15/2019, 12/06/2019    Past Medical History:  Diagnosis Date   GERD (gastroesophageal reflux disease)    Gestational diabetes    only with pregnancy    History of IBS    HLD (hyperlipidemia)    HTN (hypertension)    OSA (obstructive sleep apnea) 02/23/2023   PIH (pregnancy induced hypertension)     Tobacco History: Social History   Tobacco Use  Smoking Status Former   Current packs/day: 0.00   Types: Cigarettes   Start date: 12/2009   Quit date: 12/2017   Years since quitting: 5.5  Smokeless Tobacco Never   Counseling given: Not Answered   Outpatient Medications Prior to Visit  Medication Sig Dispense Refill   acetaminophen  (TYLENOL ) 500 MG tablet Take 500-1,000 mg by mouth every 6 (six) hours as needed for moderate pain or headache.     amLODipine  (NORVASC ) 10 MG tablet Take 1 tablet (10 mg total) by mouth daily. 30 tablet 0   atorvastatin (LIPITOR) 20 MG tablet Take 1 tablet by mouth daily.     ibuprofen  (ADVIL ) 600 MG tablet Take 1 tablet (600 mg total) by mouth every 6 (six) hours  as needed. 30 tablet 0   losartan -hydrochlorothiazide  (HYZAAR) 100-25 MG tablet Take 1 tablet by mouth daily.     omeprazole (PRILOSEC) 20 MG capsule Take 20 mg by mouth 2 (two) times daily.     No facility-administered medications prior to visit.     Review of Systems:   Constitutional:   No  weight loss, night sweats,  Fevers, chills, fatigue, or  lassitude.  HEENT:   No headaches,  Difficulty swallowing,  Tooth/dental problems, or  Sore throat,                No sneezing, itching, ear ache, nasal congestion, post nasal drip,   CV:  No chest pain,  Orthopnea, PND, swelling in lower extremities, anasarca, dizziness, palpitations, syncope.   GI  No heartburn, indigestion, abdominal pain, nausea, vomiting, diarrhea, change in bowel habits, loss of appetite, bloody stools.   Resp: No shortness of breath with exertion or at rest.  No excess mucus, no productive cough,  No non-productive cough,  No coughing up of blood.  No change in color of mucus.  No wheezing.  No chest wall deformity  Skin: no rash or lesions.  GU: no dysuria, change in color of urine, no urgency or frequency.  No flank pain, no hematuria   MS:  No joint pain or swelling.  No decreased range of motion.  No back pain.    Physical Exam  There were no vitals taken for this visit.  GEN: A/Ox3; pleasant , NAD, well nourished    HEENT:  [place]/AT,  EACs-clear, TMs-wnl, NOSE-clear, THROAT-clear, no lesions, no postnasal drip or exudate noted.   NECK:  Supple w/ fair ROM; no JVD; normal carotid impulses w/o bruits; no thyromegaly or nodules palpated; no lymphadenopathy.    RESP  Clear  P & A; w/o, wheezes/ rales/ or rhonchi. no accessory muscle use, no dullness to percussion  CARD:  RRR, no m/r/g, no peripheral edema, pulses intact, no cyanosis or clubbing.  GI:   Soft & nt; nml bowel sounds; no organomegaly or masses detected.   Musco: Warm bil, no deformities or joint swelling noted.   Neuro: alert, no focal  deficits noted.    Skin: Warm, no lesions or rashes    Lab Results:  CBC    Component Value Date/Time   WBC 13.0 (H) 08/25/2021 1101   WBC 11.8 (H) 05/12/2020 0858   RBC 4.16 08/25/2021 1101   HGB 11.1 (L) 08/25/2021 1101   HCT 34.5 (L) 08/25/2021 1101   PLT 425 (H) 08/25/2021 1101   MCV 82.9 08/25/2021 1101   MCH 26.7 08/25/2021 1101   MCHC 32.2 08/25/2021 1101   RDW 17.6 (H) 08/25/2021 1101   LYMPHSABS 2.2 08/25/2021 1101   MONOABS 0.7 08/25/2021 1101   EOSABS 0.2 08/25/2021 1101   BASOSABS 0.1 08/25/2021 1101    BMET    Component Value Date/Time   NA 137 05/12/2020 0858   K 3.5 05/12/2020 0858   CL 103 05/12/2020 0858   CO2 22 05/12/2020 0858   GLUCOSE 87 05/12/2020 0858   BUN 13 05/12/2020 0858   CREATININE 0.77 05/12/2020 0858   CALCIUM 9.7 05/12/2020 0858   GFRNONAA >60 05/12/2020 0858   GFRAA >60 07/13/2019 1055    BNP    Component Value Date/Time   BNP 68.9 01/11/2018 0955    ProBNP No results found for: "PROBNP"  Imaging: No results found.  Administration History     None           No data to display          No results found for: "NITRICOXIDE"      Assessment & Plan:   No problem-specific Assessment & Plan notes found for this encounter.     Roena Clark, NP 07/11/2023

## 2023-07-11 NOTE — Patient Instructions (Signed)
 Wear CPAP therapy at bedtime, wear all night long for at least 6 or more hours Work on healthy weight loss Do not drive if sleepy Follow up in 4-6 months and as needed

## 2023-07-12 DIAGNOSIS — Z6834 Body mass index (BMI) 34.0-34.9, adult: Secondary | ICD-10-CM | POA: Insufficient documentation

## 2023-07-12 NOTE — Assessment & Plan Note (Signed)
 Healthy weight loss

## 2023-07-12 NOTE — Assessment & Plan Note (Signed)
 Excellent compliance and control on CPAP with perceived benefit. Encouraged on daily usage with goal >6hr each night  Plan  Patient Instructions  Wear CPAP therapy at bedtime, wear all night long for at least 6 or more hours Work on healthy weight loss Do not drive if sleepy Follow up in 4-6 months and as needed

## 2023-11-16 ENCOUNTER — Other Ambulatory Visit: Payer: Self-pay | Admitting: General Surgery

## 2023-11-16 DIAGNOSIS — R1033 Periumbilical pain: Secondary | ICD-10-CM

## 2023-11-17 ENCOUNTER — Encounter: Payer: Self-pay | Admitting: General Surgery

## 2023-11-23 ENCOUNTER — Ambulatory Visit
Admission: RE | Admit: 2023-11-23 | Discharge: 2023-11-23 | Disposition: A | Discharge status: Home / Self Care | Source: Ambulatory Visit | Attending: General Surgery | Admitting: General Surgery

## 2023-11-23 DIAGNOSIS — R1033 Periumbilical pain: Secondary | ICD-10-CM

## 2023-11-23 MED ORDER — IOPAMIDOL (ISOVUE-300) INJECTION 61%
100.0000 mL | Freq: Once | INTRAVENOUS | Status: AC | PRN
  Administered 2023-11-23: 100 mL via INTRAVENOUS
